# Patient Record
Sex: Female | Born: 1955 | Race: White | Hispanic: No | Smoking: Current every day smoker
Health system: Southern US, Community
[De-identification: ages and names within clinical notes are randomized; demographics above are authoritative.]

## PROBLEM LIST (undated history)

## (undated) DIAGNOSIS — L409 Psoriasis, unspecified: Secondary | ICD-10-CM

## (undated) DIAGNOSIS — E039 Hypothyroidism, unspecified: Secondary | ICD-10-CM

## (undated) DIAGNOSIS — F329 Major depressive disorder, single episode, unspecified: Secondary | ICD-10-CM

## (undated) DIAGNOSIS — F411 Generalized anxiety disorder: Secondary | ICD-10-CM

## (undated) DIAGNOSIS — R030 Elevated blood-pressure reading, without diagnosis of hypertension: Secondary | ICD-10-CM

## (undated) DIAGNOSIS — I10 Essential (primary) hypertension: Secondary | ICD-10-CM

## (undated) DIAGNOSIS — J209 Acute bronchitis, unspecified: Secondary | ICD-10-CM

## (undated) HISTORY — DX: Major depressive disorder, single episode, unspecified: F32.9

## (undated) HISTORY — DX: Elevated blood-pressure reading, without diagnosis of hypertension: R03.0

## (undated) HISTORY — DX: Generalized anxiety disorder: F41.1

## (undated) HISTORY — DX: Acute bronchitis, unspecified: J20.9

## (undated) HISTORY — DX: Essential (primary) hypertension: I10

## (undated) HISTORY — DX: Hypothyroidism, unspecified: E03.9

---

## 1998-07-17 ENCOUNTER — Other Ambulatory Visit: Admission: RE | Admit: 1998-07-17 | Discharge: 1998-07-17 | Payer: Self-pay | Admitting: Gynecology

## 1998-08-01 ENCOUNTER — Other Ambulatory Visit: Admission: RE | Admit: 1998-08-01 | Discharge: 1998-08-01 | Payer: Self-pay | Admitting: Gynecology

## 1999-02-18 ENCOUNTER — Other Ambulatory Visit: Admission: RE | Admit: 1999-02-18 | Discharge: 1999-02-18 | Payer: Self-pay | Admitting: Gynecology

## 1999-11-11 ENCOUNTER — Other Ambulatory Visit: Admission: RE | Admit: 1999-11-11 | Discharge: 1999-11-11 | Payer: Self-pay | Admitting: Gynecology

## 2001-05-13 ENCOUNTER — Other Ambulatory Visit: Admission: RE | Admit: 2001-05-13 | Discharge: 2001-05-13 | Payer: Self-pay | Admitting: Gynecology

## 2003-08-30 ENCOUNTER — Other Ambulatory Visit: Admission: RE | Admit: 2003-08-30 | Discharge: 2003-08-30 | Payer: Self-pay | Admitting: Obstetrics and Gynecology

## 2004-10-21 ENCOUNTER — Other Ambulatory Visit: Admission: RE | Admit: 2004-10-21 | Discharge: 2004-10-21 | Payer: Self-pay | Admitting: Obstetrics and Gynecology

## 2005-11-04 ENCOUNTER — Emergency Department (HOSPITAL_COMMUNITY): Admission: EM | Admit: 2005-11-04 | Discharge: 2005-11-04 | Payer: Self-pay | Admitting: Emergency Medicine

## 2006-02-13 ENCOUNTER — Ambulatory Visit (HOSPITAL_COMMUNITY): Admission: RE | Admit: 2006-02-13 | Discharge: 2006-02-13 | Payer: Self-pay | Admitting: Family Medicine

## 2006-03-16 ENCOUNTER — Emergency Department (HOSPITAL_COMMUNITY): Admission: EM | Admit: 2006-03-16 | Discharge: 2006-03-16 | Payer: Self-pay | Admitting: Emergency Medicine

## 2006-03-20 ENCOUNTER — Ambulatory Visit (HOSPITAL_COMMUNITY): Admission: RE | Admit: 2006-03-20 | Discharge: 2006-03-20 | Payer: Self-pay | Admitting: Urology

## 2006-04-02 ENCOUNTER — Ambulatory Visit (HOSPITAL_COMMUNITY): Admission: RE | Admit: 2006-04-02 | Discharge: 2006-04-02 | Payer: Self-pay | Admitting: Urology

## 2006-05-05 HISTORY — PX: URETHRA SURGERY: SHX824

## 2006-05-18 ENCOUNTER — Ambulatory Visit (HOSPITAL_COMMUNITY): Admission: RE | Admit: 2006-05-18 | Discharge: 2006-05-18 | Payer: Self-pay | Admitting: Urology

## 2006-05-27 ENCOUNTER — Inpatient Hospital Stay (HOSPITAL_COMMUNITY): Admission: RE | Admit: 2006-05-27 | Discharge: 2006-05-29 | Payer: Self-pay | Admitting: Urology

## 2006-05-27 ENCOUNTER — Encounter (INDEPENDENT_AMBULATORY_CARE_PROVIDER_SITE_OTHER): Payer: Self-pay | Admitting: Specialist

## 2006-08-25 ENCOUNTER — Ambulatory Visit (HOSPITAL_COMMUNITY): Admission: RE | Admit: 2006-08-25 | Discharge: 2006-08-25 | Payer: Self-pay | Admitting: Urology

## 2007-05-25 ENCOUNTER — Ambulatory Visit (HOSPITAL_COMMUNITY): Admission: RE | Admit: 2007-05-25 | Discharge: 2007-05-25 | Payer: Self-pay | Admitting: Urology

## 2007-08-12 LAB — HM MAMMOGRAPHY: HM Mammogram: NORMAL

## 2007-11-04 ENCOUNTER — Encounter: Payer: Self-pay | Admitting: Family Medicine

## 2008-08-15 ENCOUNTER — Encounter: Payer: Self-pay | Admitting: Family Medicine

## 2008-09-05 ENCOUNTER — Ambulatory Visit (HOSPITAL_COMMUNITY): Admission: RE | Admit: 2008-09-05 | Discharge: 2008-09-05 | Payer: Self-pay | Admitting: Urology

## 2008-09-08 ENCOUNTER — Ambulatory Visit: Payer: Self-pay | Admitting: Family Medicine

## 2008-09-08 DIAGNOSIS — E039 Hypothyroidism, unspecified: Secondary | ICD-10-CM

## 2008-09-08 DIAGNOSIS — F3289 Other specified depressive episodes: Secondary | ICD-10-CM

## 2008-09-08 DIAGNOSIS — F329 Major depressive disorder, single episode, unspecified: Secondary | ICD-10-CM

## 2008-09-08 DIAGNOSIS — I10 Essential (primary) hypertension: Secondary | ICD-10-CM

## 2008-09-08 DIAGNOSIS — F411 Generalized anxiety disorder: Secondary | ICD-10-CM

## 2008-09-08 HISTORY — DX: Essential (primary) hypertension: I10

## 2008-09-08 HISTORY — DX: Major depressive disorder, single episode, unspecified: F32.9

## 2008-09-08 HISTORY — DX: Generalized anxiety disorder: F41.1

## 2008-09-08 HISTORY — DX: Other specified depressive episodes: F32.89

## 2008-09-08 HISTORY — DX: Hypothyroidism, unspecified: E03.9

## 2008-09-20 ENCOUNTER — Ambulatory Visit: Payer: Self-pay | Admitting: Family Medicine

## 2008-09-21 ENCOUNTER — Telehealth: Payer: Self-pay | Admitting: Family Medicine

## 2008-10-16 ENCOUNTER — Telehealth (INDEPENDENT_AMBULATORY_CARE_PROVIDER_SITE_OTHER): Payer: Self-pay | Admitting: *Deleted

## 2009-02-20 ENCOUNTER — Ambulatory Visit: Payer: Self-pay | Admitting: Family Medicine

## 2009-02-20 DIAGNOSIS — J209 Acute bronchitis, unspecified: Secondary | ICD-10-CM

## 2009-02-20 HISTORY — DX: Acute bronchitis, unspecified: J20.9

## 2009-02-21 LAB — CONVERTED CEMR LAB: TSH: 1.59 microintl units/mL (ref 0.35–5.50)

## 2009-05-03 ENCOUNTER — Telehealth: Payer: Self-pay | Admitting: Family Medicine

## 2009-07-16 ENCOUNTER — Ambulatory Visit: Payer: Self-pay | Admitting: Family Medicine

## 2009-07-16 DIAGNOSIS — R030 Elevated blood-pressure reading, without diagnosis of hypertension: Secondary | ICD-10-CM | POA: Insufficient documentation

## 2009-07-16 HISTORY — DX: Elevated blood-pressure reading, without diagnosis of hypertension: R03.0

## 2009-10-17 ENCOUNTER — Ambulatory Visit: Payer: Self-pay | Admitting: Family Medicine

## 2009-10-17 DIAGNOSIS — L919 Hypertrophic disorder of the skin, unspecified: Secondary | ICD-10-CM

## 2009-10-17 DIAGNOSIS — R5381 Other malaise: Secondary | ICD-10-CM | POA: Insufficient documentation

## 2009-10-17 DIAGNOSIS — L409 Psoriasis, unspecified: Secondary | ICD-10-CM | POA: Insufficient documentation

## 2009-10-17 DIAGNOSIS — R5383 Other fatigue: Secondary | ICD-10-CM

## 2009-10-17 DIAGNOSIS — L909 Atrophic disorder of skin, unspecified: Secondary | ICD-10-CM | POA: Insufficient documentation

## 2009-10-18 ENCOUNTER — Encounter: Payer: Self-pay | Admitting: Family Medicine

## 2009-10-31 ENCOUNTER — Telehealth (INDEPENDENT_AMBULATORY_CARE_PROVIDER_SITE_OTHER): Payer: Self-pay | Admitting: *Deleted

## 2009-11-01 ENCOUNTER — Telehealth: Payer: Self-pay | Admitting: Family Medicine

## 2009-12-24 ENCOUNTER — Telehealth: Payer: Self-pay | Admitting: Family Medicine

## 2010-04-10 ENCOUNTER — Ambulatory Visit: Payer: Self-pay | Admitting: Family Medicine

## 2010-04-10 DIAGNOSIS — R05 Cough: Secondary | ICD-10-CM

## 2010-04-10 DIAGNOSIS — R059 Cough, unspecified: Secondary | ICD-10-CM | POA: Insufficient documentation

## 2010-06-04 NOTE — Assessment & Plan Note (Signed)
Summary: back pain/cjr   Vital Signs:  Patient profile:   55 year old female Weight:      168 pounds Temp:     98.2 degrees F oral BP sitting:   138 / 92  (left arm) Cuff size:   regular  Vitals Entered By: Sid Falcon LPN (April 10, 2010 3:38 PM)  History of Present Illness: Patient seen with back pain left periscapular region radiating toward the midline. Present for approximately 2 weeks in conjunction with cough. Pain is mild to moderate severity. She has sharp quality relieved with ibuprofen. Increased with movement. No pleuritic pain. Cough is productive of clear mucus. No hemoptysis. No fevers or chills. Mucinex DM with mild relief. Patient has a long history of smoking. No appetite or weight changes. No abdominal pain.  Allergies: 1)  ! Codeine Sulfate (Codeine Sulfate) 2)  ! Claritin-D 24 Hour (Loratadine-Pseudoephedrine)  Past History:  Past Medical History: Last updated: 07/16/2009 Depression Arthritis Emphysema/Chrinic bronchitis Hypothyroid Crohn's diease  Review of Systems  The patient denies anorexia, fever, weight loss, prolonged cough, and hemoptysis.    Physical Exam  General:  Well-developed,well-nourished,in no acute distress; alert,appropriate and cooperative throughout examination Mouth:  Oral mucosa and oropharynx without lesions or exudates.  Teeth in good repair. Neck:  No deformities, masses, or tenderness noted. Lungs:  Normal respiratory effort, chest expands symmetrically. Lungs are clear to auscultation, no crackles or wheezes. Heart:  normal rate, regular rhythm, and no gallop.   Msk:  patient has some soft tissue tenderness left parathoracic region just below the scapula. No rashes. Chest is clear to auscultation this region. No spinal tenderness Extremities:  No clubbing, cyanosis, edema, or deformity noted with normal full range of motion of all joints.     Impression & Recommendations:  Problem # 1:  BACK PAIN, THORACIC REGION  (ICD-724.1) suspect muscular.  ?related to recent cough.  Trial Celebrex 200mg  daily with samples given.  Problem # 2:  COUGH (ICD-786.2) probably viral. consider CXR if no better in 2 weeks.  Complete Medication List: 1)  Alprazolam 0.5 Mg Tabs (Alprazolam) .... 1/2 tab daily 2)  Amitriptyline Hcl 50 Mg Tabs (Amitriptyline hcl) .... One tab daily 3)  Levoxyl 125 Mcg Tabs (Levothyroxine sodium) .... Once daily 4)  Fish Oil Double Strength 1200 Mg Caps (Omega-3 fatty acids) .... Once daily 5)  Calcium 600/vitamin D 600-400 Mg-unit Tabs (Calcium carbonate-vitamin d) .... Once daily 6)  Multi For Her 50+ Tabs (Multiple vitamins-minerals) .... Once daily 7)  Prozac 20 Mg Caps (Fluoxetine hcl) .... Once daily  Patient Instructions: 1)  Celebrex 200 mg one tablet daily 2)  Touch base if cough and back pain or not improving over the next one to 2 weeks   Orders Added: 1)  Est. Patient Level III [60454]

## 2010-06-04 NOTE — Progress Notes (Signed)
Summary: LMTCB  Phone Note Call from Patient   Caller: Patient Call For: Evelena Peat MD Summary of Call: Pt calls asking to talk to a nurse. Called back and left message to call back. Tried again & still reached vm.  Rudy Jew, RN  October 31, 2009 3:34 PM  Initial call taken by: Lynann Beaver CMA,  October 31, 2009 11:41 AM  Follow-up for Phone Call        noted .  Try again by tomorrow and if no response by then wait to see if pt calls back. Follow-up by: Evelena Peat MD,  October 31, 2009 4:13 PM  Additional Follow-up for Phone Call Additional follow up Details #1::        LMTCB again. Additional Follow-up by: Lynann Beaver CMA,  November 01, 2009 8:59 AM

## 2010-06-04 NOTE — Assessment & Plan Note (Signed)
Summary: flu like symptoms/cjr   Vital Signs:  Patient profile:   55 year old female Weight:      168 pounds Temp:     98.2 degrees F oral BP sitting:   140 / 90  (left arm) Cuff size:   regular  Vitals Entered By: Kathrynn Speed CMA (October 17, 2009 3:56 PM)  History of Present Illness: Patient seen for the following items.  Two day history of flulike symptoms. She has bodyaches, sore throat and mild headache. Does have some nondescript bilateral back pain around her kidneys. Mild frequency but no burning with urination. Concerned about possibility of UTI.  prior history of ureteral surgery 2 years ago.  Patient needs refills of thyroid medication and Prozac which has taken for some depression issues. Depression is stable.   Compliant with meds.  Patient has asymptomatic skin lesion left cheek twitching like to have treated. This has grown over the past couple of months. No personal history of skin cancer.  Current Medications (verified): 1)  Alprazolam 0.5 Mg Tabs (Alprazolam) .... 1/2 Tab Daily 2)  Amitriptyline Hcl 50 Mg Tabs (Amitriptyline Hcl) .... One Tab Daily 3)  Levoxyl 125 Mcg Tabs (Levothyroxine Sodium) .... Once Daily 4)  Fish Oil Double Strength 1200 Mg Caps (Omega-3 Fatty Acids) .... Once Daily 5)  Calcium 600/vitamin D 600-400 Mg-Unit Tabs (Calcium Carbonate-Vitamin D) .... Once Daily 6)  Multi For Her 50+  Tabs (Multiple Vitamins-Minerals) .... Once Daily 7)  Prozac 20 Mg Caps (Fluoxetine Hcl) .... Once Daily  Allergies (verified): 1)  ! Codeine Sulfate (Codeine Sulfate) 2)  ! 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    Physical Exam  General:  Well-developed,well-nourished,in no acute distress; alert,appropriate and cooperative throughout examination Head:  Normocephalic and atraumatic without obvious abnormalities. No apparent alopecia or balding. Ears:  External ear exam shows no significant lesions or deformities.  Otoscopic examination reveals clear canals, tympanic membranes are intact bilaterally without bulging, retraction, inflammation or discharge. Hearing is grossly normal bilaterally. Mouth:  Oral mucosa and oropharynx without lesions or exudates.  Teeth in good repair. Neck:  No deformities, masses, or tenderness noted. Lungs:  Normal respiratory effort, chest expands symmetrically. Lungs are clear to auscultation, no crackles or wheezes. Heart:  Normal rate and regular rhythm. S1 and S2 normal without gallop, murmur, click, rub or other extra sounds. Skin:  left cheek region reveals small skin tag. This has narrow base and no significant pigmentation change   Impression & Recommendations:  Problem # 1:  MALAISE (ICD-780.79) Assessment New pyuria, rule out UTI.   Urine cx done. Orders: UA Dipstick w/o Micro (manual) (53664) T-Culture, Urine ([id number])  Problem # 2:  SKIN TAG (ICD-701.9)  discussed risk and benefits of treatment liquid nitrogen and patient consented. Left cheek lesion was treated without difficulty and patient tolerated well  Orders: Cryotherapy/Destruction benign or premalignant lesion (1st lesion)  (17000)  Problem # 3:  HYPOTHYROIDISM (ICD-244.9)  Her updated medication list for this problem includes:    Levoxyl 125 Mcg Tabs (Levothyroxine sodium) .Marland KitchenMarland KitchenMarland KitchenMarland Kitchen  Once  daily  Problem # 4:  DEPRESSION (ICD-311)  Her updated medication list for this problem includes:    Alprazolam 0.5 Mg Tabs (Alprazolam) .Marland Kitchen... 1/2 tab daily    Amitriptyline Hcl 50 Mg Tabs (Amitriptyline hcl) ..... One tab daily    Prozac 20 Mg Caps (Fluoxetine hcl) ..... Once daily  Problem # 5:  ANXIETY (ICD-300.00) refilled alprazolam which she uses sparingly Her updated medication list for this problem includes:    Alprazolam 0.5 Mg Tabs (Alprazolam) .Marland Kitchen... 1/2 tab daily    Amitriptyline Hcl 50 Mg Tabs (Amitriptyline hcl) ..... One tab daily    Prozac 20 Mg Caps (Fluoxetine hcl) ..... Once daily  Complete Medication List: 1)  Alprazolam 0.5 Mg Tabs (Alprazolam) .... 1/2 tab daily 2)  Amitriptyline Hcl 50 Mg Tabs (Amitriptyline hcl) .... One tab daily 3)  Levoxyl 125 Mcg Tabs (Levothyroxine sodium) .... Once daily 4)  Fish Oil Double Strength 1200 Mg Caps (Omega-3 fatty acids) .... Once daily 5)  Calcium 600/vitamin D 600-400 Mg-unit Tabs (Calcium carbonate-vitamin d) .... Once daily 6)  Multi For Her 50+ Tabs (Multiple vitamins-minerals) .... Once daily 7)  Prozac 20 Mg Caps (Fluoxetine hcl) .... Once daily  Patient Instructions: 1)  Drink plenty of fluids up to 3-4 quarts a day. Cranberry juice is especially recommended in addition to large amounts of water. Avoid caffeine & carbonated drinks, they tend to irritate the bladder, Return in 3-5 days if you're not better: sooner if you're feeling worse.  2)  Please schedule a follow-up appointment in 6 months .  3)  TSH prior to visit ICD-9 : 244.9 Prescriptions: ALPRAZOLAM 0.5 MG TABS (ALPRAZOLAM) 1/2 tab daily  #30 x 3   Entered and Authorized by:   Evelena Peat MD   Signed by:   Evelena Peat MD on 10/17/2009   Method used:   Print then Give to Patient   RxID:   [id number] PROZAC 20 MG CAPS (FLUOXETINE HCL) once daily  #90 x 3   Entered and Authorized by:   Evelena Peat MD   Signed by:   Evelena Peat MD on  10/17/2009   Method used:   Electronically to        [facility]* (retail)       [address] [place], [place]  [address]       Ph: [phone] or [phone]       Fax: [phone]   RxID:   [id number] LEVOXYL 125 MCG TABS (LEVOTHYROXINE SODIUM) once daily  #90 x 3   Entered and Authorized by:   Evelena Peat MD   Signed by:   Evelena Peat MD on 10/17/2009   Method used:   Electronically to        [facility]* (retail)       [address] [place], [place]  [address]       Ph: [phone] or [phone]       Fax: [phone]   RxID:   [id number]

## 2010-06-04 NOTE — Progress Notes (Signed)
Summary: asking for recommendation  Phone Note Call from Patient   Caller: Patient Call For: Evelena Peat MD Summary of Call: Father has dementia and DM.......is asking if Dr. Caryl Never would give a recommendation of a good MD for him......? neurology or geriatrics??? [phone] Initial call taken by: Lynann Beaver CMA,  November 01, 2009 9:15 AM  Follow-up for Phone Call        He really needs a good primary care physician.  One issue is finding someone that takes Medicare.  Primary care doctor can address both issues. Follow-up by: Evelena Peat MD,  November 01, 2009 9:59 AM  Additional Follow-up for Phone Call Additional follow up Details #1::        Notified Raul...Marland KitchenMarland KitchenMarland Kitchen Additional Follow-up by: Lynann Beaver CMA,  November 01, 2009 10:49 AM

## 2010-06-04 NOTE — Assessment & Plan Note (Signed)
Summary: bp check//ccm   Vital Signs:  Patient profile:   55 year old female Weight:      163 pounds BMI:     28.30 Temp:     98.7 degrees F oral BP sitting:   124 / 80  (left arm) Cuff size:   regular  Vitals Entered By: Sid Falcon LPN (July 16, 2009 1:03 PM)  Nutrition Counseling: Patient's BMI is greater than 25 and therefore counseled on weight management options.  Serial Vital Signs/Assessments:  Time      Position  BP       Pulse  Resp  Temp     By                     122/80                         Evelena Peat MD  CC: BP check   History of Present Illness: Patient here to evaluate elevated blood pressure. Never treated for hypertension. Recent elevation of 140/100 at cardiologist's office. Denies any headaches, dizziness, or chest pains. No regular exercise. Drinks about 12 ounces of alcohol per day. No nonsteroidal use.  Allergies: 1)  ! Codeine Sulfate (Codeine Sulfate) 2)  ! Claritin-D 24 Hour (Loratadine-Pseudoephedrine)  Past History:  Past Medical History: Depression Arthritis Emphysema/Chrinic bronchitis Hypothyroid Crohn's diease  Review of Systems  The patient denies anorexia, fever, weight loss, weight gain, chest pain, syncope, dyspnea on exertion, peripheral edema, and headaches.    Physical Exam  General:  Well-developed,well-nourished,in no acute distress; alert,appropriate and cooperative throughout examination Mouth:  Oral mucosa and oropharynx without lesions or exudates.  Teeth in good repair. Neck:  No deformities, masses, or tenderness noted. Lungs:  Normal respiratory effort, chest expands symmetrically. Lungs are clear to auscultation, no crackles or wheezes. Heart:  Normal rate and regular rhythm. S1 and S2 normal without gallop, murmur, click, rub or other extra sounds. Extremities:  No clubbing, cyanosis, edema, or deformity noted with normal full range of motion of all joints.     Impression & Recommendations:  Problem #  1:  ELEVATED BLOOD PRESSURE (ICD-796.2) normal here today.  Reduce wine consumption to no more than 10 oz/day.  Monitor closely at home.  exercise and reduce Na intake.  Complete Medication List: 1)  Alprazolam 0.5 Mg Tabs (Alprazolam) .... 1/2 tab daily 2)  Amitriptyline Hcl 50 Mg Tabs (Amitriptyline hcl) .... One tab daily 3)  Levoxyl 125 Mcg Tabs (Levothyroxine sodium) .... Once daily 4)  Fish Oil Double Strength 1200 Mg Caps (Omega-3 fatty acids) .... Once daily 5)  Calcium 600/vitamin D 600-400 Mg-unit Tabs (Calcium carbonate-vitamin d) .... Once daily 6)  Multi For Her 50+ Tabs (Multiple vitamins-minerals) .... Once daily 7)  Prozac 20 Mg Caps (Fluoxetine hcl) .... Once daily 8)  Lomotil 2.5-0.025 Mg Tabs (Diphenoxylate-atropine) .... One to two tablets qid as needed for diarrhea. 9)  Azithromycin 250 Mg Tabs (Azithromycin) .... Two by mouth today then one by mouth once daily for 4 days  Patient Instructions: 1)  Consume no more than 12 ounces wine/day. 2)  Limit your Sodium(salt) .  3)  It is important that you exercise reguarly at least 20 minutes 5 times a week. If you develop chest pain, have severe difficulty breathing, or feel very tired, stop exercising immediately and seek medical attention.  4)  Check your  Blood Pressure regularly . If it is above: 140/90  you should make an appointment.

## 2010-06-04 NOTE — Progress Notes (Signed)
Summary: Alprazolam refill  Phone Note Call from Patient Call back at Home Phone [phone]   Caller: Patient Call For: Colleen Peat MD Reason for Call: Lab or Test Results Summary of Call: We received several requests from [facility] in [place] for Alprazolam.  Pt was given written Rx on 10/17/09 at [facility].  Pt informed.  She states she gave it to pharmacist at the [facility] in [place].  Pt has checked with the pharmacy, they have no record.  She cannot find it.  Requesting refills. Per Nedra Hai at [facility], last filled 6/20 from Rx written on 05-03-2009  Initial call taken by: Sid Falcon LPN,  December 24, 2009 2:32 PM  Follow-up for Phone Call        She has never misused.  May refill #30 with 3 refills. Follow-up by: Colleen Peat MD,  December 24, 2009 5:51 PM  Additional Follow-up for Phone Call Additional follow up Details #1::        Pt informed on personally identified VM Rx called into [facility] Additional Follow-up by: Sid Falcon LPN,  December 25, 2009 8:33 AM    Prescriptions: ALPRAZOLAM 0.5 MG TABS (ALPRAZOLAM) 1/2 tab daily  #30 x 3   Entered by:   Sid Falcon LPN   Authorized by:   Colleen Peat MD   Signed by:   Sid Falcon LPN on 09/81/1914   Method used:   Telephoned to ...       [facility] [address]* (retail)       [address] [place], [place]  [address]       Ph: [phone]       Fax: [phone]   RxID:   [id number]

## 2010-07-02 ENCOUNTER — Other Ambulatory Visit: Payer: Self-pay | Admitting: Family Medicine

## 2010-07-04 NOTE — Telephone Encounter (Signed)
Last filled 12/25/09, #30 with 3 refills

## 2010-07-05 NOTE — Telephone Encounter (Signed)
Ok to refill for 3 months.  

## 2010-07-05 NOTE — Telephone Encounter (Signed)
Last OV 04/10/10, last refill 12/25/09 (back pain) 1/2 tab daily, #30 , RF 3

## 2010-07-08 ENCOUNTER — Other Ambulatory Visit: Payer: Self-pay | Admitting: *Deleted

## 2010-07-08 DIAGNOSIS — F411 Generalized anxiety disorder: Secondary | ICD-10-CM

## 2010-07-08 MED ORDER — ALPRAZOLAM 0.5 MG PO TABS: ORAL_TABLET | ORAL | Status: DC

## 2010-07-08 NOTE — Telephone Encounter (Signed)
Rx called in 

## 2010-07-08 NOTE — Telephone Encounter (Signed)
Pt requesting refill Alprazolam 0.5 mg, pt may take 1/2 tab daily prn.  Last filled 12/24/09  Pt was seen in Dec, 2011 for back pain [employer]

## 2010-07-08 NOTE — Telephone Encounter (Signed)
May refill #30 with 3 refills

## 2010-07-08 NOTE — Telephone Encounter (Signed)
Last filled 12/24/09 #30 with 3 refills.  Pt takes 1/2 tab daily

## 2010-07-08 NOTE — Telephone Encounter (Signed)
OK to refill for 6 months 

## 2010-07-09 NOTE — Telephone Encounter (Signed)
Duplicate request

## 2010-08-12 ENCOUNTER — Encounter: Payer: Self-pay | Admitting: Family Medicine

## 2010-08-12 ENCOUNTER — Ambulatory Visit (INDEPENDENT_AMBULATORY_CARE_PROVIDER_SITE_OTHER): Payer: BLUE CROSS/BLUE SHIELD | Admitting: Family Medicine

## 2010-08-12 VITALS — BP 140/100 | Temp 98.3°F | Ht 64.0 in | Wt 154.0 lb

## 2010-08-12 DIAGNOSIS — R062 Wheezing: Secondary | ICD-10-CM

## 2010-08-12 DIAGNOSIS — R05 Cough: Secondary | ICD-10-CM

## 2010-08-12 MED ORDER — METHYLPREDNISOLONE ACETATE 80 MG/ML IJ SUSP
80.0000 mg | Freq: Once | INTRAMUSCULAR | Status: AC
  Administered 2010-08-12: 80 mg via INTRAMUSCULAR

## 2010-08-12 NOTE — Patient Instructions (Signed)
Colleen Owen out a full 7 day course of current antibiotic. Follow up promptly for any fever over 101 or worsening symptoms

## 2010-08-12 NOTE — Progress Notes (Signed)
  Subjective:    Patient ID: Colleen Owen, female    DOB: December 15, 1955, 55 y.o.   MRN: [id number]  HPI Patient here with illness which started last week about 4-5 days ago. Started with some diffuse headaches, mostly nonproductive cough, malaise, nasal congestion, and body aches. Over the weekend probable low grade fever. Started herself on amoxicillin 900 mg twice a day. 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 Granddaughter has similar symptoms.  She does still smoke. Some wheezing. Some dyspnea with exertion   Review of Systems  Constitutional: Positive for fever, chills and fatigue. Negative for appetite change and unexpected weight change.  HENT: Positive for congestion and postnasal drip. Negative for sore throat, trouble swallowing and voice change.   Respiratory: Positive for cough and wheezing. Negative for shortness of breath.   Cardiovascular: Negative for chest pain and leg swelling.       Objective:   Physical Exam  Constitutional: She appears well-developed and well-nourished.  HENT:  Head: Normocephalic and atraumatic.  Right Ear: External ear normal.  Left Ear: External ear normal.  Mouth/Throat: No oropharyngeal exudate.  Neck: Neck supple. No thyromegaly present.  Cardiovascular: Normal rate and regular rhythm.   Pulmonary/Chest:       Patient has some diffuse wheezes but no retractions. No rales. Symmetric breath sounds.  Musculoskeletal: She exhibits no edema.  Lymphadenopathy:    She has no cervical adenopathy.          Assessment & Plan:  Probable viral syndrome in a patient with likely COPD. She has reactive airway component. She's been intolerant to oral prednisone previously with insomnia. Depo-Medrol 80 mg IM and finish out antibiotic which she started already. Followup probably for fever or worsening symptoms

## 2010-09-02 ENCOUNTER — Other Ambulatory Visit: Payer: Self-pay | Admitting: Family Medicine

## 2010-09-20 NOTE — Op Note (Signed)
NAMEKIRSTI, Colleen Owen                 ACCOUNT NO.:  [id number]   MEDICAL RECORD NO.:  [id number]          PATIENT TYPE:  INP   LOCATION:  1431                         FACILITY:  [facility]   PHYSICIAN:  Heloise Purpura, MD      DATE OF BIRTH:  Sep 27, 1955   DATE OF PROCEDURE:  05/27/2006  DATE OF DISCHARGE:                               OPERATIVE REPORT   PREOPERATIVE DIAGNOSIS:  Left ureteropelvic junction obstruction.   POSTOPERATIVE DIAGNOSIS:  Left ureteropelvic junction obstruction.   PROCEDURES.:  1. Cystoscopy.  2. Left retrograde pyelography.  3. Left ureteral stent placement (6 x 28).  4. Left robotic assisted laparoscopic dismembered pyeloplasty.   SURGEON:  Dr. Heloise Purpura.   ASSISTANT:  Cornelious Bryant, MD.   ANESTHESIA:  General.   COMPLICATIONS:  None.   ESTIMATED BLOOD LOSS:  Minimal.   SPECIMEN:  Left ureteropelvic junction.   DRAINS:  1. 16-French Foley catheter.  2. #15 Blake perinephric drain.   RADIOLOGIC FINDINGS:  Retrograde pyelography of the left collecting  system demonstrated a dilated renal pelvis down to the level of the UPJ  without filling defects or other abnormalities.   INDICATION:  Colleen Owen a 55 year old female who initially presented with  severe left-sided flank pain.  She underwent a CT scan which  demonstrated left hydronephrosis down to the level of the ureteropelvic  junction.  She subsequently underwent further evaluation including  retrograde pyelography and a nuclear medicine renal scan with findings  consistent with a ureteropelvic junction obstruction.  After discussing  management options, she elected to proceed with the above procedures.  The potential risks and benefits were discussed with the patient and she  consented.   DESCRIPTION OF PROCEDURE:  The patient was taken to the operating room  and a general anesthetic was administered.  She was given preoperative  antibiotics, placed in the dorsal lithotomy position, prepped  and draped  in the usual sterile fashion.  In addition, the patient was on chronic  steroid therapy and was administered 100 mg of intravenous  hydrocortisone for a stress dose.  A preoperative time-out was  performed.  Cystourethroscopy was then performed and the left ureteral  orifice was identified.  A 6-French open-ended ureteral catheter was  then used to perform retrograde pyelography.  This demonstrated the  patient's indwelling nephrostomy tube to be in place.  The left renal  pelvis was dilated without evidence of filling defects.  There was noted  to be a normal caliber ureter without filling defects.  These findings  were consistent with her known ureteropelvic junction obstruction.  A  0.038 sensor guidewire was then inserted through the ureteral catheter  and up into the renal pelvis under fluoroscopic guidance.  An 8 x 28  double-J ureteral stent was then advanced over the wire and  appropriately positioned under fluoroscopic and cystoscopic guidance.  The wire was then removed with a good curl noted in the renal pelvis as  well as in the bladder.  The cystoscope was then removed and a 16-French  Foley catheter was placed.  The patient was then  repositioned in the  left modified flank position with care to pad all potential pressure  points. She was then reprepped and draped in the usual sterile fashion.  Another preoperative time-out was performed at this point.  A site was  then selected just superior to the umbilicus for placement of the camera  port.  This was placed using a standard open Hassan technique.  This  allowed entry into the peritoneal cavity under direct vision.  A 12 mm  port was then placed and a pneumoperitoneum was established.  The 30  degree lens was then inserted through the port and used to inspect the  abdomen.  There were noted to be no intra-abdominal adhesions or other  abnormalities and no intra-abdominal injuries.  Attention then turned to   placement of the remaining ports.  An 8 mm robotic port was placed  approximately midway between the umbilicus and xiphoid process just to  the left of midline.  An additional 8 mm robotic port was placed more  inferior and laterally in the left lower quadrant.  A 12 mm port was  then placed in the lower midline for laparoscopic assistance.  All ports  were placed under direct vision and without difficulty.  The surgical  cart was then docked.  With the aid of the cautery scissors, the white  line of Toldt was incised along the length of the descending colon  allowing the colon to be mobilized medially in the space between the  anterior layer of Gerota's fascia and the colonic mesentery to be  developed.  This helped to expose the retroperitoneum.  The ureter was  then identified and was able to be lifted anteriorly off the psoas  muscle.  There was noted to be a structure just lateral to the ureter  that appeared to be peristalsing.  This was examined and followed up  superiorly and eventually found simply to be a prominent gonadal vein.  The ureter was followed superiorly until the renal pelvis was  identified.  A small stenotic segment at the ureteropelvic junction was  identified and appeared to represent the etiology of the patient's  ureteropelvic junction obstruction.  At this point, the round tip  scissors were used to excise the stenotic segment of the UPJ.  The renal  pelvis was then spatulated medially and the ureter was spatulated  laterally. 4-0 Vicryl interrupted sutures were then placed at the  lateral and medial apices and used to reapproximate the ureter and renal  pelvis at these locations.  4-0 Vicryl running sutures were then used to  close the posterior and anterior aspect of the anastomosis.  Prior to  final closure of the anterior anastomosis, the ureteral stent was placed  back and positioned within the renal pelvis.  A #15 Blake drain was then brought through the  most lateral and inferior robotic port and  appropriately positioned near the anastomosis.  The colon was then  placed back up over the kidney.  The mesentery was noted to be intact  with no mesenteric windows.  The attention then turned to closure.  The  patient's drain was secured to the skin with a nylon suture.  The 12 mm  port sites were then closed with interrupted #0 Vicryl sutures which  were placed with the aid of the suture passer device.  All remaining  ports were removed under direct vision and hemostasis was ensured.  All  port sites were then injected with 0.25% Marcaine and reapproximated at  the skin level with staples.  Sterile dressings were applied.  The  patient appeared to tolerate the procedure well and without  complications.  She was able to be extubated and transferred to the  recovery unit in satisfactory condition.           ______________________________  Heloise Purpura, MD  Electronically Signed     LB/MEDQ  D:  05/27/2006  T:  05/27/2006  Job:  [id number]

## 2010-09-20 NOTE — Discharge Summary (Signed)
Colleen, Owen                 ACCOUNT NO.:  [id number]   MEDICAL RECORD NO.:  [id number]          PATIENT TYPE:  INP   LOCATION:  1431                         FACILITY:  [facility]   PHYSICIAN:  Heloise Purpura, MD      DATE OF BIRTH:  Jan 06, 1956   DATE OF ADMISSION:  05/27/2006  DATE OF DISCHARGE:  05/29/2006                               DISCHARGE SUMMARY   ADMISSION DIAGNOSIS:  Left ureteropelvic junction obstruction.   POSTOPERATIVE DIAGNOSIS:  Left ureteropelvic junction obstruction.   PROCEDURE:  Robotic-assisted left laparoscopic left dismembered  pyeloplasty.   HISTORY AND PHYSICAL:  For full details, please see admission history  physical.  Briefly Colleen Owen is a 55 year old female who began having severe  left-sided flank pain.  She underwent further evaluation and was found  to have findings consistent with a left UPJ obstruction.  She was found  to have preserved renal function on room nuclear medicine renography.  After discussing management options, she elected to proceed with a  dismembered pyeloplasty.  After discussing different surgical  approaches, she wished to proceed with a minimally invasive robotic-  assisted laparoscopic approach.   HOSPITAL COURSE:  The patient was taken to the operating room on May 27, 2006 and underwent the above procedure.  She tolerated this well and  without complications.  Postoperatively, she was able to be transferred  to a regular hospital room following recovery from anesthesia.  On  postoperative day #1, she was able to begin a clear liquid diet, and her  diet was gradually advance throughout the day.  She was able to begin  ambulating, which she did without difficulty, and was able to be  maintained on oral pain medication.  She was administered stress dose  steroids around the time of her procedure which were then gradually  weaned down.  She eventually was placed back on her oral prednisone, and  by postoperative day #2 was  tolerating a regular diet, ambulating  without difficulty, and had otherwise met all discharge criteria.  In  addition, her Foley catheter had been removed on postoperative day #1,  and she maintain minimal output from her perinephric drain.  This was  checked for a creatinine value, and it was found to be consistent with  serum, indicating no evidence of a urine leak.  Her drain was therefore  removed prior to discharge.   DISPOSITION:  Home.   DISCHARGE MEDICATIONS:  Jacynda has been instructed to resume her regular  medications, excepting any aspirin, nonsteroidal anti-inflammatory  drugs, or herbal supplements.  She was told to resume her prednisone as  before surgery on a 5 mg every other day basis, with plans to contact  Dr. Randa Evens next week for further instruction.  I also gave her a  prescription to take Vicodin as needed for pain, and told her to use  Colace as a stool softener.   DISCHARGE INSTRUCTIONS:  Shaylee was instructed to refrain from any heavy  lifting or strenuous activity.  She was instructed on signs and symptoms  of wound infection, and told to call should  she have any problems.   FOLLOW UP:  Prisha will follow up in approximately 1-2 weeks for removal  of her staples.           ______________________________  Heloise Purpura, MD  Electronically Signed     LB/MEDQ  D:  05/29/2006  T:  05/29/2006  Job:  [id number]   cc:   Fayrene Fearing L. Malon Kindle., M.D.  Fax: [phone]

## 2010-09-20 NOTE — H&P (Signed)
NAMEINEZ, STANTZ                 ACCOUNT NO.:  [id number]   MEDICAL RECORD NO.:  [id number]          PATIENT TYPE:  INP   LOCATION:  Y782                         FACILITY:  [facility]   PHYSICIAN:  Heloise Purpura, MD      DATE OF BIRTH:  Feb 05, 1956   DATE OF ADMISSION:  05/27/2006  DATE OF DISCHARGE:                              HISTORY & PHYSICAL   CHIEF COMPLAINT:  Left ureteropelvic junction obstruction.   HISTORY:  Ms. Treichler is a 55 year old female who initially presented with  severe left-sided flank pain.  She was subsequently found to have left-  sided hydronephrosis.  She underwent further evaluation including  retrograde pyelography and a Lasix renogram which demonstrated findings  consistent with the ureteropelvic junction obstruction.  Initially,  ureteral stent was placed.  This was subsequently removed, and a  percutaneous nephrostomy tube was placed to allow ureteral inflammation  to subside.  She presents today for definitive correction of her UPJ  obstruction.  After having discussed options previously, the patient has  elected to proceed with a robotic-assisted laparoscopic dismembered  pyeloplasty.   PAST MEDICAL HISTORY:  1. Hypothyroidism.  2. Ulcerative colitis.  3. History of shingles.   PAST SURGICAL HISTORY:  Bunionectomy.   MEDICATIONS:  1. Levoxyl.  2. Asacol.  3. Prednisone 5 mg p.o. every other day.  4. Amitriptyline 25 mg daily.   ALLERGIES:  INTOLERANCE TO CODEINE WITH VOMITING.  NO TRUE DRUG  ALLERGIES.   SOCIAL HISTORY:  The patient does smoke 2 packs cigarettes a day which  she has done for 35 years.  She drinks 1 to 2 glasses of alcohol per  day.   FAMILY HISTORY:  The patient's mother had breast cancer and  hypertension.  Her grandfather had kidney failure.   REVIEW OF SYSTEMS:  Negative except for abdominal pain and diarrhea.   PHYSICAL EXAMINATION:  CONSTITUTIONAL:  Alert and oriented in no acute  distress.  CARDIOVASCULAR:  Regular  rate and rhythm without obvious murmurs.  LUNGS:  Clear bilaterally.  ABDOMEN:  Soft, nontender, nondistended.  BACK:  The patient has an indwelling left nephrostomy tube.  EXTREMITIES:  No edema.   IMPRESSION:  Left ureteropelvic junction obstruction.   PLAN:  The patient will undergo cystoscopy with retrograde pyelography  and ureteral stent placement.  She will then undergo a robotic-assisted  laparoscopic dismembered pyeloplasty.  Risks and benefits have been  discussed with the patient.           ______________________________  Heloise Purpura, MD  Electronically Signed     LB/MEDQ  D:  05/27/2006  T:  05/27/2006  Job:  [id number]

## 2010-09-20 NOTE — Op Note (Signed)
Colleen Owen, Colleen Owen                 ACCOUNT NO.:  [id number]   MEDICAL RECORD NO.:  [id number]          PATIENT TYPE:  AMB   LOCATION:  DAY                          FACILITY:  [facility]   PHYSICIAN:  Heloise Purpura, MD      DATE OF BIRTH:  [birth year]   DATE OF PROCEDURE:  03/20/2006  DATE OF DISCHARGE:                                 OPERATIVE REPORT   PREOPERATIVE DIAGNOSES:  1. Left hydronephrosis.  2. Left flank pain.   POSTOPERATIVE DIAGNOSES:  1. Left hydronephrosis.  2. Left flank pain.   PROCEDURES:  1. Cystoscopy.  2. Left retrograde pyelography.  3. Left ureteral stent placement (6 x 24).   SURGEON:  Crecencio Mc, M.D.   ANESTHESIA:  General.   COMPLICATIONS:  None.   INDICATIONS:  Colleen Owen is a 55 year old female who has had multiple recent  episodes of severe left-sided flank pain.  She was felt to possibly have a  kidney stone and underwent a CT scan, which demonstrated left-sided  hydronephrosis with a normal caliber ureter.  No calculi was identified.  She was recently evaluated by Dr. Annabell Howells, and after discussion, the patient  elected to proceed with cystoscopy and retrograde pyelography for diagnostic  purposes -- as well as to undergo ureteral stent placement to relieve her  discomfort.  Potential risks and benefits were discussed with the patient  and she consented.   DESCRIPTION OF PROCEDURE:  The patient was taken to the operating room and a  general anesthetic was administered.  She was given preoperative  antibiotics, placed in the dorsal lithotomy position, and prepped and draped  in the usual sterile fashion.  Next, a preoperative time-out was performed.  Cystourethroscopy was performed.  This demonstrated a normal appearing  bladder and the ureteral orifices in the normal anatomic position.  No  evidence of any bladder stones, tumors, or other mucosal pathology was  identified.  Attention was then turned to the left ureteral orifice.  It was  cannulated  with a 6-French open-ended ureteral catheter and contrast was  injected.  This demonstrated a normal caliber ureter without filling defects  or other abnormalities.  There was noted to be a narrowed ureteropelvic  junction, with a significantly dilated left renal pelvis.  No obvious  filling defects were identified.  A 0.038 sensor guidewire was then advanced  up into the renal pelvis under fluoroscopic guidance.  The ureteral catheter  was removed and a 6 x 24 double-J ureteral stent was advanced over the wire  and appropriately  positioned under fluoroscopic and cystoscopic guidance.  The wire was  removed, with a good curl noted in the renal pelvis as well as in the  bladder.  The patient's bladder was emptied.  She tolerated the procedure  well without complications.  She was able to be awakened and transferred to  the recovery unit in satisfactory condition.           ______________________________  Heloise Purpura, MD  Electronically Signed     LB/MEDQ  D:  03/20/2006  T:  03/21/2006  Job:  [id number]

## 2010-09-24 ENCOUNTER — Encounter: Payer: Self-pay | Admitting: Family Medicine

## 2010-09-24 ENCOUNTER — Ambulatory Visit (INDEPENDENT_AMBULATORY_CARE_PROVIDER_SITE_OTHER): Payer: BLUE CROSS/BLUE SHIELD | Admitting: Family Medicine

## 2010-09-24 VITALS — BP 150/90 | Temp 98.3°F | Wt 167.0 lb

## 2010-09-24 DIAGNOSIS — K219 Gastro-esophageal reflux disease without esophagitis: Secondary | ICD-10-CM

## 2010-09-24 DIAGNOSIS — Z0000 Encounter for general adult medical examination without abnormal findings: Secondary | ICD-10-CM

## 2010-09-24 DIAGNOSIS — R05 Cough: Secondary | ICD-10-CM

## 2010-09-24 DIAGNOSIS — R030 Elevated blood-pressure reading, without diagnosis of hypertension: Secondary | ICD-10-CM

## 2010-09-24 DIAGNOSIS — R42 Dizziness and giddiness: Secondary | ICD-10-CM

## 2010-09-24 LAB — POCT URINALYSIS DIPSTICK
Blood, UA: NEGATIVE
Protein, UA: NEGATIVE
Spec Grav, UA: 1.005
Urobilinogen, UA: 0.2

## 2010-09-24 NOTE — Progress Notes (Signed)
  Subjective:    Patient ID: Colleen Owen, female    DOB: 1955-10-25, 55 y.o.   MRN: [id number]  HPI Patient seen for this several following items   New symptom of vertigo. First noted this morning getting out of bed. Some associated nausea but no vomiting. Symptoms have improved through the day. No syncope or presyncope. Denies any hearing changes. No fever or chills. Denies any diplopia, focal weakness or ataxia. Symptoms exacerbated by movement.  Some recent increase in GERD symptoms. Some recent weight gain. History of nicotine use. Increased chocolate consumption. Occasional caffeine use.  Tums with some relief  Chronic cough for approximately 2 months. Long history of smoking. Some increased early-morning sputum production. Denies any fever, chills, hemoptysis, pleuritic pain, increased dyspnea over baseline,, increased wheezing, or any appetite or weight changes. No postnasal drip symptoms. Occasional GERD as above.  Elevated blood pressure. By home readings around 130/90. No headaches or dizziness. No regular exercise. No regular alcohol use.  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 Denies any burning with urination. Occasional frequency. No back pain. No fever or chills. Requesting repeat urine today  Review of Systems  Constitutional: Negative for fever, chills, appetite change, fatigue and unexpected weight change.  Respiratory: Positive for cough and shortness of breath. Negative for wheezing and stridor.   Cardiovascular: Negative for chest pain, palpitations and leg swelling.  Gastrointestinal: Negative for abdominal pain and blood in stool.  Genitourinary: Positive for frequency. Negative for dysuria, hematuria and flank pain.  Musculoskeletal: Positive for back pain.  Neurological: Positive for dizziness. Negative for seizures, syncope, weakness and headaches.  Hematological: Negative for adenopathy.         Objective:   Physical Exam  Constitutional: She is oriented to person, place, and time. She appears well-developed and well-nourished.  HENT:  Head: Normocephalic and atraumatic.  Right Ear: External ear normal.  Left Ear: External ear normal.  Mouth/Throat: No oropharyngeal exudate.  Neck: Neck supple. No thyromegaly present.  Cardiovascular: Normal rate, regular rhythm and normal heart sounds.   Pulmonary/Chest: Effort normal and breath sounds normal. No respiratory distress. She has no wheezes. She has no rales.       Slightly diminished breath sounds throughout but clear  Musculoskeletal: She exhibits no edema.  Lymphadenopathy:    She has no cervical adenopathy.  Neurological: She is alert and oriented to person, place, and time.       Cerebellar function normal. No focal strength deficits.  Skin: No rash noted.          Assessment & Plan:  #1 vertigo. Suspect benign positional vertigo. Reassurance given. Be in touch if persists or any new symptoms. Consider extinguishing exercises if persists #2 pyuria. Urine culture. No antibiotics at this time since she has no fever or urinary symptoms #3 GERD. Discussed lifestyle modification. Try over-the-counter H2 blocker initially #4 chronic cough. She is encouraged to stop smoking. Chest x-ray given duration of symptoms though she has no red flags. Patient needs spirometry. Suspect some early COPD #5 elevated blood pressure. Borderline elevation. Work on weight loss. Reassess 3 months

## 2010-09-26 LAB — URINE CULTURE: Colony Count: 9000

## 2010-09-26 NOTE — Progress Notes (Signed)
Quick Note:  Pt informed ______ 

## 2010-10-21 ENCOUNTER — Other Ambulatory Visit: Payer: Self-pay | Admitting: Family Medicine

## 2011-02-17 ENCOUNTER — Ambulatory Visit (INDEPENDENT_AMBULATORY_CARE_PROVIDER_SITE_OTHER): Payer: BC Managed Care – PPO | Admitting: Family Medicine

## 2011-02-17 ENCOUNTER — Encounter: Payer: Self-pay | Admitting: Family Medicine

## 2011-02-17 DIAGNOSIS — R05 Cough: Secondary | ICD-10-CM

## 2011-02-17 DIAGNOSIS — R079 Chest pain, unspecified: Secondary | ICD-10-CM

## 2011-02-17 NOTE — Progress Notes (Signed)
  Subjective:    Patient ID: Colleen Owen, female    DOB: 08-Mar-1956, 55 y.o.   MRN: [id number]  HPI Acute visit. Patient seen with somewhat atypical chest pain. Onset about one week ago. Particularly worse Saturday after eating bacon.  She describes relatively constant symptoms since then. Some substernal chest tightness and occasional burning which is improved after burping.   TookTUMS without relief. No exertional quality. No dyspnea. Also drinks about 2 glasses of wine per day which may be exacerbating.. No cardiac history. No arm or neck pain. Patient continues to smoke about one pack cigarettes per day.  Chronic cough for several months. No increase in dyspnea. No hemoptysis. No pleuritic pain. Denies appetite or weight changes. We have previously suggested chest x-ray but she has not gone yet.  Past Medical History  Diagnosis Date  . HYPOTHYROIDISM 09/08/2008  . ANXIETY 09/08/2008  . DEPRESSION 09/08/2008  . HYPERTENSION 09/08/2008  . Acute bronchitis 02/20/2009  . ELEVATED BLOOD PRESSURE 07/16/2009   Past Surgical History  Procedure Date  . Urethra surgery 2008    reports that she has been smoking Cigarettes.  She has a 67.5 pack-year smoking history. She does not have any smokeless tobacco history on file. Her alcohol and drug histories not on file. family history includes Alcohol abuse in her maternal grandfather; Cancer in her mother; Diabetes in her father; Hypertension in her mother; and Stroke in her father. Allergies  Allergen Reactions  . Codeine Sulfate     REACTION: GI upset  . Loratadine-Pseudoephedrine       Review of Systems  Constitutional: Negative for appetite change, fatigue and unexpected weight change.  Respiratory: Positive for cough. Negative for shortness of breath and wheezing.   Cardiovascular: Positive for chest pain. Negative for palpitations and leg swelling.  Gastrointestinal: Negative for abdominal pain.  Neurological: Negative for syncope, weakness and  headaches.  Hematological: Negative for adenopathy.       Objective:   Physical Exam  Constitutional: She is oriented to person, place, and time. She appears well-developed and well-nourished. No distress.  Neck: Neck supple. No thyromegaly present.  Cardiovascular: Normal rate and regular rhythm.   Pulmonary/Chest: Effort normal and breath sounds normal. No respiratory distress. She has no wheezes. She has no rales.  Abdominal: Soft. There is no tenderness.  Musculoskeletal: She exhibits no edema.  Neurological: She is alert and oriented to person, place, and time.          Assessment & Plan:  #1 atypical chest pain. Suspect related to GERD. Discussed lifestyle modification. EKG unremarkable. Samples Dexilant 1 daily and followup promptly if symptoms worsen or if they persist in the next few days. #2 chronic cough. Ongoing nicotine use. Chest x-ray ordered.  ?related to GERD.  No red flags such as weight loss, hemoptysis, etc.

## 2011-02-17 NOTE — Patient Instructions (Signed)
Dexilant one tablet daily Follow up if no improvement after one week. Follow up immediately for any exertional chest pain.

## 2011-02-18 ENCOUNTER — Ambulatory Visit (INDEPENDENT_AMBULATORY_CARE_PROVIDER_SITE_OTHER)
Admission: RE | Admit: 2011-02-18 | Discharge: 2011-02-18 | Disposition: A | Discharge status: Home / Self Care | Payer: BC Managed Care – PPO | Source: Ambulatory Visit | Attending: Family Medicine | Admitting: Family Medicine

## 2011-02-18 DIAGNOSIS — R05 Cough: Secondary | ICD-10-CM

## 2011-02-19 ENCOUNTER — Telehealth: Payer: Self-pay | Admitting: *Deleted

## 2011-02-19 NOTE — Progress Notes (Signed)
Quick Note:  Pt informed on VM ______ 

## 2011-02-19 NOTE — Telephone Encounter (Signed)
LMTCB

## 2011-02-19 NOTE — Telephone Encounter (Signed)
Pt. Is calling for chest xray results.

## 2011-02-19 NOTE — Telephone Encounter (Signed)
No acute finding

## 2011-02-20 NOTE — Telephone Encounter (Signed)
LMTCB again at pt's work.

## 2011-02-20 NOTE — Telephone Encounter (Signed)
Pt given chest xray results'  

## 2011-02-27 ENCOUNTER — Other Ambulatory Visit: Payer: Self-pay | Admitting: Family Medicine

## 2011-02-27 NOTE — Telephone Encounter (Signed)
Last filled 07-08-2010, #30 with 3 refills Sig: 1/2 tab daily as needed

## 2011-02-27 NOTE — Telephone Encounter (Signed)
Refill for 6 months. 

## 2011-02-28 NOTE — Telephone Encounter (Signed)
See note.  Already answered.  Refill for 6 months.

## 2011-03-06 MED ORDER — ALPRAZOLAM 0.5 MG PO TABS: ORAL_TABLET | ORAL | Status: DC

## 2011-03-24 ENCOUNTER — Telehealth: Payer: Self-pay | Admitting: *Deleted

## 2011-03-24 NOTE — Telephone Encounter (Signed)
Pt would like to have a stronger antidepressant than Prozac called to pharmacy.  She is going home for the holidays and thinks she will need it.

## 2011-03-25 NOTE — Telephone Encounter (Signed)
Notified. 

## 2011-03-25 NOTE — Telephone Encounter (Signed)
Office visit to discuss options. 

## 2011-03-31 ENCOUNTER — Ambulatory Visit (INDEPENDENT_AMBULATORY_CARE_PROVIDER_SITE_OTHER): Payer: BC Managed Care – PPO | Admitting: Family Medicine

## 2011-03-31 ENCOUNTER — Encounter: Payer: Self-pay | Admitting: Family Medicine

## 2011-03-31 VITALS — BP 112/84 | Temp 98.1°F | Wt 171.0 lb

## 2011-03-31 DIAGNOSIS — E039 Hypothyroidism, unspecified: Secondary | ICD-10-CM

## 2011-03-31 DIAGNOSIS — F329 Major depressive disorder, single episode, unspecified: Secondary | ICD-10-CM

## 2011-03-31 LAB — TSH: TSH: 0.81 u[IU]/mL (ref 0.35–5.50)

## 2011-03-31 NOTE — Patient Instructions (Signed)
Stop Prozac. Start Pristiq 50 mg once daily.

## 2011-03-31 NOTE — Progress Notes (Signed)
  Subjective:    Patient ID: Colleen Owen, female    DOB: 1955/05/26, 55 y.o.   MRN: [id number]  HPI  Evaluation depression. Long history of recurrent depression. Loss of both parents during past year. Has been present for several years and feels Prozac is not helping much at all. She wants to explore other options. Because of recent sleep disturbance. Low motivation. Depressed mood. No suicidal ideation.  History of hypothyroidism. Needs repeat TSH. Takes levothyroxine 125 mg daily. Compliant with therapy. No regular exercise. Weight stable. Appetite fair.  Some chronic fatigue.  Past Medical History  Diagnosis Date  . HYPOTHYROIDISM 09/08/2008  . ANXIETY 09/08/2008  . DEPRESSION 09/08/2008  . HYPERTENSION 09/08/2008  . Acute bronchitis 02/20/2009  . ELEVATED BLOOD PRESSURE 07/16/2009   Past Surgical History  Procedure Date  . Urethra surgery 2008    reports that she has been smoking Cigarettes.  She has a 67.5 pack-year smoking history. She does not have any smokeless tobacco history on file. Her alcohol and drug histories not on file. family history includes Alcohol abuse in her maternal grandfather; Cancer in her mother; Diabetes in her father; Hypertension in her mother; and Stroke in her father. Allergies  Allergen Reactions  . Codeine Sulfate     REACTION: GI upset  . Loratadine-Pseudoephedrine       Review of Systems  Constitutional: Positive for fatigue. Negative for chills, appetite change and unexpected weight change.  Respiratory: Negative for cough and shortness of breath.   Cardiovascular: Negative for chest pain, palpitations and leg swelling.  Gastrointestinal: Negative for abdominal pain.  Psychiatric/Behavioral: Positive for dysphoric mood. Negative for suicidal ideas, confusion, self-injury and agitation.       Objective:   Physical Exam  Constitutional: She is oriented to person, place, and time. She appears well-developed and well-nourished.  Cardiovascular:  Normal rate and regular rhythm.   Pulmonary/Chest: Effort normal and breath sounds normal. No respiratory distress. She has no wheezes. She has no rales.  Musculoskeletal: She exhibits no edema.  Neurological: She is alert and oriented to person, place, and time. No cranial nerve deficit.  Psychiatric: She has a normal mood and affect. Her behavior is normal.          Assessment & Plan:  #1 recurrent depression. Discontinue Prozac. Start Pristiq 50 mg 1 daily. Samples provided. Reassess 3-4 weeks and reviewed possible side effects #2 hypothyroidism. Recheck TSH.

## 2011-04-02 NOTE — Progress Notes (Signed)
Quick Note:  Pt informed on personally identified VM ______ 

## 2011-04-25 ENCOUNTER — Encounter: Payer: Self-pay | Admitting: Family Medicine

## 2011-04-25 ENCOUNTER — Ambulatory Visit (INDEPENDENT_AMBULATORY_CARE_PROVIDER_SITE_OTHER): Payer: BC Managed Care – PPO | Admitting: Family Medicine

## 2011-04-25 DIAGNOSIS — F329 Major depressive disorder, single episode, unspecified: Secondary | ICD-10-CM

## 2011-04-25 MED ORDER — DESVENLAFAXINE SUCCINATE ER 50 MG PO TB24: 50.0000 mg | ORAL_TABLET | Freq: Every day | ORAL | Status: DC

## 2011-04-25 NOTE — Progress Notes (Signed)
  Subjective:    Patient ID: Colleen Owen, female    DOB: [birth year], 55 y.o.   MRN: [id number]  HPI  Followup depression. Patient had been on Prozac for several years was having increasing problems with depression. We changed her to Pristiq and she has had a great response. Much less fatigue. Increased motivation. Less depressed mood. No side effects. She is very pleased with response. No suicidal ideation. Recent TSH normal. Patient takes thyroid replacement.   Review of Systems  Constitutional: Negative for appetite change and unexpected weight change.  Respiratory: Negative for shortness of breath.   Cardiovascular: Negative for chest pain.  Psychiatric/Behavioral: Negative for dysphoric mood and agitation.       Objective:   Physical Exam  Constitutional: She appears well-developed and well-nourished. No distress.  Cardiovascular: Normal rate and regular rhythm.   Pulmonary/Chest: Effort normal and breath sounds normal.  Psychiatric: She has a normal mood and affect. Her behavior is normal.          Assessment & Plan:  Chronic/recurrent depression improved on Pristiq. Refills given for 6 months and samples provided for one month.

## 2011-05-15 ENCOUNTER — Telehealth: Payer: Self-pay | Admitting: *Deleted

## 2011-05-15 NOTE — Telephone Encounter (Addendum)
Pt is asking to stop Pristiq and wants to know if she needs taper and how??

## 2011-05-15 NOTE — Telephone Encounter (Signed)
No need to taper

## 2011-05-15 NOTE — Telephone Encounter (Signed)
Pt aware.

## 2011-06-09 ENCOUNTER — Ambulatory Visit (INDEPENDENT_AMBULATORY_CARE_PROVIDER_SITE_OTHER): Payer: BC Managed Care – PPO | Admitting: Family Medicine

## 2011-06-09 ENCOUNTER — Encounter: Payer: Self-pay | Admitting: Family Medicine

## 2011-06-09 VITALS — BP 128/90 | Temp 98.1°F | Wt 171.0 lb

## 2011-06-09 DIAGNOSIS — J209 Acute bronchitis, unspecified: Secondary | ICD-10-CM

## 2011-06-09 MED ORDER — AZITHROMYCIN 250 MG PO TABS: ORAL_TABLET | ORAL | Status: AC

## 2011-06-09 NOTE — Progress Notes (Signed)
  Subjective:    Patient ID: Colleen Owen, female    DOB: 1955-05-29, 56 y.o.   MRN: [id number]  HPI  Acute visit. Respiratory illness which started last week. Progressively worse. Mostly cough.  Some dis- colored sputum. She works at a [employer], sports and started prednisone 5 mg daily and felt slightly better. Still smokes one half pack cigarettes per day. Some nasal congestion. No sore throat. No dyspnea.   Review of Systems  HENT: Positive for congestion. Negative for ear pain, sore throat and voice change.   Respiratory: Positive for cough. Negative for shortness of breath and wheezing.   Cardiovascular: Negative for chest pain.  Neurological: Negative for headaches.       Objective:   Physical Exam  Constitutional: She appears well-developed and well-nourished.  HENT:  Right Ear: External ear normal.  Left Ear: External ear normal.  Mouth/Throat: Oropharynx is clear and moist.  Neck: Neck supple.  Cardiovascular: Normal rate and regular rhythm.   Pulmonary/Chest: Effort normal and breath sounds normal. No respiratory distress. She has no wheezes. She has no rales.  Lymphadenopathy:    She has no cervical adenopathy.          Assessment & Plan:  Acute bronchitis. Patient encouraged to stop smoking. Drink plenty of fluids. If she notes any fever or worsening symptoms start Zithromax

## 2011-06-09 NOTE — Patient Instructions (Signed)

## 2011-07-28 ENCOUNTER — Telehealth: Payer: Self-pay | Admitting: *Deleted

## 2011-07-28 NOTE — Telephone Encounter (Signed)
Pt is asking if she can take the Osteobiflex with her other meds?

## 2011-07-28 NOTE — Telephone Encounter (Signed)
Notified pt. 

## 2011-07-28 NOTE — Telephone Encounter (Signed)
Yes

## 2011-08-20 ENCOUNTER — Other Ambulatory Visit: Payer: Self-pay | Admitting: Family Medicine

## 2011-08-20 NOTE — Telephone Encounter (Signed)
Patient called stating that she need a refill on her aprazolam early due to taking them day and night and she states she need 30 pills every month. Please advise.

## 2011-08-20 NOTE — Telephone Encounter (Signed)
Our sig on chart is 1/2 tab at HS prn Pt now reporting she takes 1/2 tab BID Last filled 03/06/11, #30 with 2 refills

## 2011-08-22 NOTE — Telephone Encounter (Signed)
Did we clarify if she was getting from multiple pharmacies or multiple providers?

## 2011-08-23 NOTE — Telephone Encounter (Signed)
Addended by: Melchor Amour on: 08/23/2011 01:25 PM   Modules accepted: Orders

## 2011-08-23 NOTE — Telephone Encounter (Signed)
I called pt pharmacy she had faxed a report of Xanax refills.  It appeared she was filling this med at another pharmacy.  The pharmacist determined she had 2 different Rx numbers in their system.  She cancelled [id number] and I authorized a refill on the [id number] ( which had one refill left).  Pt states Dr Caryl Never and her decided to take 1/2 tab every AM and another 1/2 tab PRN at HS.  The sig at the pharmacy was 1/2 tab every day as needed.  I corrected the sig for future refills as she needs 30 pills a month.

## 2011-08-24 NOTE — Telephone Encounter (Signed)
Refill for 3 months. 

## 2011-08-25 MED ORDER — ALPRAZOLAM 0.5 MG PO TABS: ORAL_TABLET | ORAL | Status: DC

## 2011-08-25 NOTE — Telephone Encounter (Signed)
Addended by: Melchor Amour on: 08/25/2011 11:46 AM   Modules accepted: Orders

## 2011-08-27 ENCOUNTER — Ambulatory Visit (INDEPENDENT_AMBULATORY_CARE_PROVIDER_SITE_OTHER): Payer: BC Managed Care – PPO | Admitting: Family Medicine

## 2011-08-27 ENCOUNTER — Encounter: Payer: Self-pay | Admitting: Family Medicine

## 2011-08-27 VITALS — BP 122/86 | Temp 99.0°F | Wt 168.0 lb

## 2011-08-27 DIAGNOSIS — F411 Generalized anxiety disorder: Secondary | ICD-10-CM

## 2011-08-27 DIAGNOSIS — E039 Hypothyroidism, unspecified: Secondary | ICD-10-CM

## 2011-08-27 MED ORDER — LEVOTHYROXINE SODIUM 125 MCG PO TABS: 125.0000 ug | ORAL_TABLET | Freq: Every day | ORAL | Status: DC

## 2011-08-27 NOTE — Progress Notes (Signed)
  Subjective:    Patient ID: Colleen Owen, female    DOB: February 29, 1956, 56 y.o.   MRN: [id number]  HPI  Patient here to discuss medications. She has taken alprazolam for several years with no history of misuse. Recently we received phone call from pharmacy that she had prescriptions with 2 different prescription numbers. She has never gotten her medication from more than one pharmacy or more than one provider. Apparently one of her prescription said to take alprazolam 0.5 mg one at night as needed and another prescription said to take one daily as needed. We discussed possibly increasing her alprazolam to 0.5 mg one half tablet in the morning and one half tablet at night as needed.   She has no history of misuse. She has not taken one half tablet twice daily consistently-sometimes takes once daily.  Hypothyroidism treated with levothyroxine. Requesting refills. Labs normal back in November  Past Medical History  Diagnosis Date  . HYPOTHYROIDISM 09/08/2008  . ANXIETY 09/08/2008  . DEPRESSION 09/08/2008  . HYPERTENSION 09/08/2008  . Acute bronchitis 02/20/2009  . ELEVATED BLOOD PRESSURE 07/16/2009   Past Surgical History  Procedure Date  . Urethra surgery 2008    reports that she has been smoking Cigarettes.  She has a 67.5 pack-year smoking history. She does not have any smokeless tobacco history on file. Her alcohol and drug histories not on file. family history includes Alcohol abuse in her maternal grandfather; Cancer in her mother; Diabetes in her father; Hypertension in her mother; and Stroke in her father. Allergies  Allergen Reactions  . Codeine Sulfate     REACTION: GI upset  . Loratadine-Pseudoephedrine     "Wired, cannot sleep"      Review of Systems  Respiratory: Negative for cough and shortness of breath.   Cardiovascular: Negative for chest pain.  Neurological: Negative for dizziness and syncope.  Psychiatric/Behavioral: Negative for dysphoric mood. The patient is  nervous/anxious.        Objective:   Physical Exam  Constitutional: She is oriented to person, place, and time. She appears well-developed and well-nourished.  Cardiovascular: Normal rate and regular rhythm.   Pulmonary/Chest: Effort normal and breath sounds normal. No respiratory distress. She has no wheezes. She has no rales.  Neurological: She is alert and oriented to person, place, and time. No cranial nerve deficit.  Psychiatric: She has a normal mood and affect. Her behavior is normal. Judgment and thought content normal.          Assessment & Plan:  Patient has history of chronic anxiety and insomnia. Using very low-dose alprazolam. No history of misuse. Her prescription has been changed to reflect dosing of 0.5 mg one half tablet in the morning and one half tablet at night as needed.  Hypothyroidism. Refilled the levothyroxine

## 2011-09-30 ENCOUNTER — Telehealth: Payer: Self-pay | Admitting: Family Medicine

## 2011-09-30 NOTE — Telephone Encounter (Signed)
Yes.  She should be on alprazolam 0.5 mg one half tablet bid.  Refill for 3 months.

## 2011-09-30 NOTE — Telephone Encounter (Signed)
Call placed to patient at [phone], patient stated that [facility]  Screwed up her Rx. She states she was given a new Rx at her office visit that was sent to pharmacy. She stated the pharmacist advised her to contact office. Rx refill for April was never received by pharmacy. Is it okay to refill medication as authorized at April Office visit?

## 2011-09-30 NOTE — Telephone Encounter (Signed)
Pt is still having issues with her medication. Pt can not receive a refill on her ALPRAZolam Prudy Feeler) 0.5 MG tablet until June 15th. Pt seemed confused about how she is supposed to be taking the medication, when asked what the script should be written for she said she is supposed to take 1/2 tab in the morning and 1/2 at night. This is what last script was sent in for. Please contact pt for clarification

## 2011-10-01 MED ORDER — ALPRAZOLAM 0.5 MG PO TABS: ORAL_TABLET | ORAL | Status: DC

## 2011-10-01 NOTE — Telephone Encounter (Signed)
Call placed to [facility] pharmacy at [phone], spoke with pharmacy clarification of new directions on xanax provided to pharmacist. Call placed to patient at [phone], she was informed of Rx correction to pharmacy.

## 2012-01-13 ENCOUNTER — Other Ambulatory Visit: Payer: Self-pay | Admitting: Family Medicine

## 2012-01-15 NOTE — Telephone Encounter (Signed)
Refill for 6 months. 

## 2012-01-15 NOTE — Telephone Encounter (Signed)
Alprazolam 1/2 in am, 1/2 in pm prn last filled 10-01-11, #30 with 2 refills

## 2012-01-16 MED ORDER — ALPRAZOLAM 0.5 MG PO TABS: ORAL_TABLET | ORAL | Status: DC

## 2012-02-17 ENCOUNTER — Encounter: Payer: Self-pay | Admitting: Family Medicine

## 2012-02-17 ENCOUNTER — Ambulatory Visit (INDEPENDENT_AMBULATORY_CARE_PROVIDER_SITE_OTHER): Payer: BC Managed Care – PPO | Admitting: Family Medicine

## 2012-02-17 VITALS — BP 124/88 | HR 108 | Temp 98.2°F | Wt 159.0 lb

## 2012-02-17 DIAGNOSIS — J329 Chronic sinusitis, unspecified: Secondary | ICD-10-CM

## 2012-02-17 MED ORDER — AZITHROMYCIN 250 MG PO TABS: ORAL_TABLET | ORAL | Status: DC

## 2012-02-17 NOTE — Progress Notes (Signed)
  Subjective:    Patient ID: Colleen Owen, female    DOB: 1955/11/24, 56 y.o.   MRN: [id number]  HPI Here for one week of sinus pressure, PND, blowing green mucus from the nose, and a dry cough. No fever.   Review of Systems  Constitutional: Negative.   HENT: Positive for congestion, postnasal drip and sinus pressure.   Eyes: Negative.   Respiratory: Positive for cough.        Objective:   Physical Exam  Constitutional: She appears well-developed and well-nourished.  HENT:  Right Ear: External ear normal.  Left Ear: External ear normal.  Nose: Nose normal.  Mouth/Throat: Oropharynx is clear and moist.  Eyes: Conjunctivae normal are normal.  Pulmonary/Chest: Effort normal and breath sounds normal.  Lymphadenopathy:    She has no cervical adenopathy.          Assessment & Plan:  Add Mucinex

## 2012-03-25 ENCOUNTER — Telehealth: Payer: Self-pay | Admitting: Family Medicine

## 2012-03-25 DIAGNOSIS — E039 Hypothyroidism, unspecified: Secondary | ICD-10-CM

## 2012-03-25 NOTE — Telephone Encounter (Signed)
Pt will be losing Ins end of December. Pt would like to have another tsh blood work before end of dec. Pt last tsh bloodwork was 03-2011. Can I sch?

## 2012-03-25 NOTE — Telephone Encounter (Signed)
Yes

## 2012-03-26 NOTE — Telephone Encounter (Signed)
Pt is sch for 03-29-2012

## 2012-03-29 ENCOUNTER — Other Ambulatory Visit (INDEPENDENT_AMBULATORY_CARE_PROVIDER_SITE_OTHER): Payer: BC Managed Care – PPO

## 2012-03-29 DIAGNOSIS — E039 Hypothyroidism, unspecified: Secondary | ICD-10-CM

## 2012-03-30 NOTE — Progress Notes (Signed)
Quick Note:  Pt informed on home VM ______ 

## 2012-04-09 ENCOUNTER — Other Ambulatory Visit: Payer: Self-pay

## 2012-05-30 ENCOUNTER — Observation Stay (HOSPITAL_COMMUNITY)
Admission: EM | Admit: 2012-05-30 | Discharge: 2012-05-31 | Disposition: A | Discharge status: Home / Self Care | Payer: BC Managed Care – PPO | Attending: Emergency Medicine | Admitting: Emergency Medicine

## 2012-05-30 ENCOUNTER — Emergency Department (HOSPITAL_COMMUNITY): Payer: BC Managed Care – PPO

## 2012-05-30 ENCOUNTER — Encounter (HOSPITAL_COMMUNITY): Payer: Self-pay | Admitting: Cardiology

## 2012-05-30 DIAGNOSIS — F17200 Nicotine dependence, unspecified, uncomplicated: Secondary | ICD-10-CM | POA: Insufficient documentation

## 2012-05-30 DIAGNOSIS — Z79899 Other long term (current) drug therapy: Secondary | ICD-10-CM | POA: Insufficient documentation

## 2012-05-30 DIAGNOSIS — R110 Nausea: Secondary | ICD-10-CM | POA: Insufficient documentation

## 2012-05-30 DIAGNOSIS — F3289 Other specified depressive episodes: Secondary | ICD-10-CM | POA: Insufficient documentation

## 2012-05-30 DIAGNOSIS — R911 Solitary pulmonary nodule: Secondary | ICD-10-CM

## 2012-05-30 DIAGNOSIS — F411 Generalized anxiety disorder: Secondary | ICD-10-CM | POA: Insufficient documentation

## 2012-05-30 DIAGNOSIS — R079 Chest pain, unspecified: Principal | ICD-10-CM | POA: Insufficient documentation

## 2012-05-30 DIAGNOSIS — F329 Major depressive disorder, single episode, unspecified: Secondary | ICD-10-CM | POA: Insufficient documentation

## 2012-05-30 DIAGNOSIS — M549 Dorsalgia, unspecified: Secondary | ICD-10-CM | POA: Insufficient documentation

## 2012-05-30 DIAGNOSIS — I10 Essential (primary) hypertension: Secondary | ICD-10-CM | POA: Insufficient documentation

## 2012-05-30 DIAGNOSIS — E039 Hypothyroidism, unspecified: Secondary | ICD-10-CM | POA: Insufficient documentation

## 2012-05-30 LAB — CBC
MCH: 34 pg (ref 26.0–34.0)
MCHC: 35.9 g/dL (ref 30.0–36.0)
MCV: 94.6 fL (ref 78.0–100.0)
Platelets: 232 10*3/uL (ref 150–400)
RDW: 12.1 % (ref 11.5–15.5)

## 2012-05-30 LAB — HEPATIC FUNCTION PANEL
Albumin: 4 g/dL (ref 3.5–5.2)
Alkaline Phosphatase: 82 U/L (ref 39–117)
Bilirubin, Direct: <0.1 mg/dL (ref 0.0–0.3)
Total Bilirubin: 0.5 mg/dL (ref 0.3–1.2)

## 2012-05-30 LAB — BASIC METABOLIC PANEL
Calcium: 9.8 mg/dL (ref 8.4–10.5)
Creatinine, Ser: 0.56 mg/dL (ref 0.50–1.10)
GFR calc non Af Amer: >90 mL/min (ref >90)
Sodium: 137 mEq/L (ref 135–145)

## 2012-05-30 LAB — POCT I-STAT TROPONIN I: Troponin i, poc: 0 ng/mL (ref 0.00–0.08)

## 2012-05-30 MED ORDER — MORPHINE SULFATE 4 MG/ML IJ SOLN
4.0000 mg | Freq: Once | INTRAMUSCULAR | Status: AC
  Administered 2012-05-30: 4 mg via INTRAVENOUS
  Filled 2012-05-30: qty 1

## 2012-05-30 MED ORDER — NITROGLYCERIN 0.4 MG SL SUBL
0.4000 mg | SUBLINGUAL_TABLET | SUBLINGUAL | Status: DC | PRN
  Administered 2012-05-30: 0.4 mg via SUBLINGUAL
  Filled 2012-05-30: qty 25

## 2012-05-30 MED ORDER — AMITRIPTYLINE HCL 25 MG PO TABS: 50.0000 mg | ORAL_TABLET | Freq: Every day | ORAL | Status: DC

## 2012-05-30 MED ORDER — NICOTINE 14 MG/24HR TD PT24
14.0000 mg | MEDICATED_PATCH | Freq: Once | TRANSDERMAL | Status: DC
  Administered 2012-05-30: 14 mg via TRANSDERMAL
  Filled 2012-05-30: qty 1

## 2012-05-30 MED ORDER — IOHEXOL 350 MG/ML SOLN
100.0000 mL | Freq: Once | INTRAVENOUS | Status: AC | PRN
  Administered 2012-05-30: 100 mL via INTRAVENOUS

## 2012-05-30 NOTE — ED Provider Notes (Signed)
Pt received from Dr. Ethelda Chick.  57yo F w/ h/o HTN and tobacco abuse presented to ED w/ mid-sternal CP w/ radiation through to back.  EKG, labs and CXR unremarkable.  CTA chest ordered to r/o aortic dissection and shows right upper lobe pulmonary nodule and likely incidental finding of LUQ inflammation.  All results discussed w/ pt and I advised f/u with her PCP for lung nodule.  On repeat exam, VSS, chest/back non-tender, and there is slight periumbilical tenderness.  Hepatic function panel and lipase have been ordered but I doubt pancreatitis.  Pt may have gastritis/gastric ulcer but this also seems unlikely based on characteristics of pain and lack of epigastric tenderness.  Pt will be placed on CP protocol and have a treadmill stress in am.  She c/o very mild tightness in her chest currently.  A second dose of IV morphine ordered.    Pt reported improvement in pain w/ morphine but it did not resolve.  I then treated her w/ 3 SL ntg and she is now pain-free.  Will stay in Pod C overnight.   12:05 AM   Otilio Miu, PA-C 05/31/12 0005  Pt checked out to Dr. Silverio Lay. 7:19 AM     Otilio Miu, PA-C 05/31/12 [phone]

## 2012-05-30 NOTE — ED Notes (Signed)
Pt reports midsternal chest pain that radiates into her back, also reports nausea. States SOB with initial pain. Took 325mg  ASA and reports some relief from pain. Pain 3/10 at this time, but 10/10 when it first started.

## 2012-05-30 NOTE — ED Notes (Signed)
Family and IV team at bedside.

## 2012-05-30 NOTE — ED Provider Notes (Signed)
History     CSN: [id number]  Arrival date & time 05/30/12  1617   First MD Initiated Contact with Patient 05/30/12 1800      Chief Complaint  Patient presents with  . Chest Pain    (Consider location/radiation/quality/duration/timing/severity/associated sxs/prior treatment) HPI Complains of anterior substernal chest pain radiating to her onset approximately 11 AM today. Patient treated herself with one aspirin 325 mg tablet with partial relief. Presently discomfort is minimal predominantly at her upper back. Pain is not made better or worse by anything associated symptoms include nausea no shortness of breath no sweatiness. Pain was slightly improved when she got up" moved around". Cardiac risk factors include smoker hypertension otherwise negative Past Medical History  Diagnosis Date  . HYPOTHYROIDISM 09/08/2008  . ANXIETY 09/08/2008  . DEPRESSION 09/08/2008  . HYPERTENSION 09/08/2008  . Acute bronchitis 02/20/2009  . ELEVATED BLOOD PRESSURE 07/16/2009    Past Surgical History  Procedure Date  . Urethra surgery 2008    Family History  Problem Relation Age of Onset  . Cancer Mother     cancer  . Hypertension Mother   . Diabetes Father   . Stroke Father   . Alcohol abuse Maternal Grandfather     History  Substance Use Topics  . Smoking status: Current Every Day Smoker -- 1.5 packs/day for 45 years    Types: Cigarettes  . Smokeless tobacco: Never Used  . Alcohol Use: 3.5 oz/week    7 drink(s) per week    OB History    Grav Para Term Preterm Abortions TAB SAB Ect Mult Living                  Review of Systems  Constitutional: Negative.   HENT: Negative.   Respiratory: Negative.   Cardiovascular: Positive for chest pain.  Gastrointestinal: Positive for nausea.  Musculoskeletal: Negative.   Skin: Negative.   Neurological: Negative.   Hematological: Negative.   Psychiatric/Behavioral: Negative.   All other systems reviewed and are negative.    Allergies    Codeine sulfate and Loratadine-pseudoephedrine er  Home Medications   Current Outpatient Rx  Name  Route  Sig  Dispense  Refill  . ALPRAZOLAM 0.5 MG PO TABS   Oral   Take 0.125-0.25 mg by mouth See admin instructions. 0.25mg  (1/2 tab) in the am & if needed will take 0.125mg  (1/4 tab) in the pm for anxiety         . AMITRIPTYLINE HCL 50 MG PO TABS   Oral   Take 75 mg by mouth at bedtime.          Marland Kitchen CALCIPOTRIENE-BETAMETH DIPROP 0.005-0.064 % EX OINT   Topical   Apply 1 application topically at bedtime.         Marland Kitchen CALCIUM CARBONATE 600 MG PO TABS   Oral   Take 600 mg by mouth daily.          . OMEGA-3 FATTY ACIDS 1000 MG PO CAPS   Oral   Take 1 g by mouth daily.          Marland Kitchen LEVOTHYROXINE SODIUM 125 MCG PO TABS   Oral   Take 1 tablet (125 mcg total) by mouth daily.   90 tablet   3   . MULTI-VITAMIN/MINERALS PO TABS   Oral   Take 1 tablet by mouth daily.             BP 158/101  Pulse 96  Temp 98.2 F (36.8 C) (Oral)  Resp 13  SpO2 97%  Physical Exam  Nursing note and vitals reviewed. Constitutional: She appears well-developed and well-nourished.  HENT:  Head: Normocephalic and atraumatic.  Eyes: Conjunctivae normal are normal. Pupils are equal, round, and reactive to light.  Neck: Neck supple. No tracheal deviation present. No thyromegaly present.  Cardiovascular: Normal rate and regular rhythm.   No murmur heard. Pulmonary/Chest: Effort normal and breath sounds normal.  Abdominal: Soft. Bowel sounds are normal. She exhibits no distension. There is no tenderness.  Musculoskeletal: Normal range of motion. She exhibits no edema and no tenderness.  Neurological: She is alert. Coordination normal.  Skin: Skin is warm and dry. No rash noted.  Psychiatric: She has a normal mood and affect.    ED Course  Procedures (including critical care time)  Labs Reviewed  CBC - Abnormal; Notable for the following:    Hemoglobin 15.2 (*)     All other  components within normal limits  BASIC METABOLIC PANEL - Abnormal; Notable for the following:    Glucose, Bld 123 (*)     All other components within normal limits  POCT I-STAT TROPONIN I  CBC   No results found.   No diagnosis found.    Date: 05/30/2012  Rate: 100  Rhythm: sinus tachycardia  QRS Axis: normal  Intervals: normal  ST/T Wave abnormalities: normal  Conduction Disutrbances:none  Narrative Interpretation:   Old EKG Reviewed: Tracing from 02/17/2011 shows normal sinus rhythm 80 beats per minute within normal limits interpreted by me Results for orders placed during the hospital encounter of 05/30/12  CBC      Component Value Range   WBC 10.2  4.0 - 10.5 K/uL   RBC 4.47  3.87 - 5.11 MIL/uL   Hemoglobin 15.2 (*) 12.0 - 15.0 g/dL   HCT 16.1  09.6 - 04.5 %   MCV 94.6  78.0 - 100.0 fL   MCH 34.0  26.0 - 34.0 pg   MCHC 35.9  30.0 - 36.0 g/dL   RDW 40.9  81.1 - 91.4 %   Platelets 232  150 - 400 K/uL  BASIC METABOLIC PANEL      Component Value Range   Sodium 137  135 - 145 mEq/L   Potassium 3.9  3.5 - 5.1 mEq/L   Chloride 101  96 - 112 mEq/L   CO2 23  19 - 32 mEq/L   Glucose, Bld 123 (*) 70 - 99 mg/dL   BUN 13  6 - 23 mg/dL   Creatinine, Ser 7.82  0.50 - 1.10 mg/dL   Calcium 9.8  8.4 - 95.6 mg/dL   GFR calc non Af Amer >90  >90 mL/min   GFR calc Af Amer >90  >90 mL/min  POCT I-STAT TROPONIN I      Component Value Range   Troponin i, poc 0.00  0.00 - 0.08 ng/mL   Comment 3           chest xray reviewed by me  Dg Chest 2 View  05/30/2012  *RADIOLOGY REPORT*  Clinical Data: Chest pain shortness of breath.  CHEST - 2 VIEW  Comparison: PA and lateral chest 02/18/2011 and CT chest 02/13/2006.  Findings: The lungs are emphysematous but clear.  Heart size is normal.  No pneumothorax or pleural fluid.  IMPRESSION: Emphysema without acute disease.   Original Report Authenticated By: Holley Dexter, M.D.     1940 p.m. pain improved after treatment with morphine but  not gone. Additional morphine ordered  MDM  CT scans chest ordered to rule out aortic dissection. If CT scan negative patient is relatively low risk for acute coronary syndrome with heart score 3. Her BMI is 26.6. I feel she is a good candidate for chest pain rule out protocol Patient signed out toMs Schinlever at 8:50 PM in conjunction with Dr.Nanavanti Diagnosis #1 chest pain  #2 tobacco abuse        Doug Sou, MD 05/30/12 2109

## 2012-05-30 NOTE — ED Notes (Signed)
Morrie Sheldon in CT notified that IV team unable to get 20 g in [facility] or higher for CT. Dr. Ethelda Chick has spoken to CT about using current IV for testing.

## 2012-05-31 DIAGNOSIS — R072 Precordial pain: Secondary | ICD-10-CM

## 2012-05-31 LAB — TROPONIN I: Troponin I: <0.3 ng/mL (ref <0.30)

## 2012-05-31 NOTE — ED Provider Notes (Signed)
Care assumed at sign out. Colleen Owen is a 57 y.o. female smoker here with CP. Now pain free. CT angio chest showed no dissection or PE yesterday. ? Pancreatitis but patient denies ab pain and lipase nl. Stress echo this AM was limited but within normal limits. I recommend she f/u with PMD and be referred for cardiology eval. She should stop smoking and needs a f/u chest CT in 6 months.   Results for orders placed during the hospital encounter of 05/30/12  CBC      Component Value Range   WBC 10.2  4.0 - 10.5 K/uL   RBC 4.47  3.87 - 5.11 MIL/uL   Hemoglobin 15.2 (*) 12.0 - 15.0 g/dL   HCT 96.0  45.4 - 09.8 %   MCV 94.6  78.0 - 100.0 fL   MCH 34.0  26.0 - 34.0 pg   MCHC 35.9  30.0 - 36.0 g/dL   RDW 11.9  14.7 - 82.9 %   Platelets 232  150 - 400 K/uL  BASIC METABOLIC PANEL      Component Value Range   Sodium 137  135 - 145 mEq/L   Potassium 3.9  3.5 - 5.1 mEq/L   Chloride 101  96 - 112 mEq/L   CO2 23  19 - 32 mEq/L   Glucose, Bld 123 (*) 70 - 99 mg/dL   BUN 13  6 - 23 mg/dL   Creatinine, Ser 5.62  0.50 - 1.10 mg/dL   Calcium 9.8  8.4 - 13.0 mg/dL   GFR calc non Af Amer >90  >90 mL/min   GFR calc Af Amer >90  >90 mL/min  POCT I-STAT TROPONIN I      Component Value Range   Troponin i, poc 0.00  0.00 - 0.08 ng/mL   Comment 3           LIPASE, BLOOD      Component Value Range   Lipase 41  11 - 59 U/L  HEPATIC FUNCTION PANEL      Component Value Range   Total Protein 7.2  6.0 - 8.3 g/dL   Albumin 4.0  3.5 - 5.2 g/dL   AST 21  0 - 37 U/L   ALT 18  0 - 35 U/L   Alkaline Phosphatase 82  39 - 117 U/L   Total Bilirubin 0.5  0.3 - 1.2 mg/dL   Bilirubin, Direct <8.6  0.0 - 0.3 mg/dL   Indirect Bilirubin NOT CALCULATED  0.3 - 0.9 mg/dL  TROPONIN I      Component Value Range   Troponin I <0.30  <0.30 ng/mL   Dg Chest 2 View  05/30/2012  *RADIOLOGY REPORT*  Clinical Data: Chest pain shortness of breath.  CHEST - 2 VIEW  Comparison: PA and lateral chest 02/18/2011 and CT chest  02/13/2006.  Findings: The lungs are emphysematous but clear.  Heart size is normal.  No pneumothorax or pleural fluid.  IMPRESSION: Emphysema without acute disease.   Original Report Authenticated By: Holley Dexter, M.D.    Ct Angio Chest Pe W/cm &/or Wo Cm  05/30/2012  *RADIOLOGY REPORT*  Clinical Data: 57 year old female with chest pain.  CT ANGIOGRAPHY CHEST  Technique:  Multidetector CT imaging of the chest using the standard protocol during bolus administration of intravenous contrast. Multiplanar reconstructed images including MIPs were obtained and reviewed to evaluate the vascular anatomy.  Contrast: OMNIPAQUE IOHEXOL 350 MG/ML SOLN  Comparison: 05/30/2012 chest radiograph and 02/13/2006 chest CT  Findings:  This is a technically satisfactory study.  No pulmonary emboli are identified.  There is no evidence of thoracic aortic aneurysm. Upper limits normal heart size again noted.  No pleural or pericardial effusions are present. No enlarged lymph nodes are identified.  Mild to moderate centrilobular and paraseptal emphysema is identified. Mild dependent atelectasis/scarring is noted.  A 5 mm right upper lobe noncalcified nodule (image 23) is new since 2007.  There is no evidence of airspace disease, consolidation, mass or endobronchial/endotracheal lesion.  No acute or suspicious bony abnormalities identified.  Inflammation within the posterior upper left abdomen is noted, of uncertain origin but adjacent to the proximal stomach and pancreatic body.  IMPRESSION: Posterior upper left abdominal inflammation of uncertain origin but adjacent to the proximal stomach and pancreatic body.  This may be related to a gastric abnormality/ulcer or pancreatitis.  No evidence of pulmonary emboli.  5 mm right upper lobe pulmonary nodule - new since 2007. If the patient is at high risk for bronchogenic carcinoma, follow-up chest CT at 6-12 months is recommended.  If the patient is at low risk for bronchogenic  carcinoma, follow-up chest CT at 12 months is recommended.  This recommendation follows the consensus statement: Guidelines for Management of Small Pulmonary Nodules Detected on CT Scans: A Statement from the Fleischner Society as published in Radiology 2005; 237:395-400.  Mild to moderate centrilobular emphysema.   Original Report Authenticated By: Harmon Pier, M.D.        Richardean Canal, MD 05/31/12 [phone]

## 2012-05-31 NOTE — Progress Notes (Signed)
  Echocardiogram Echocardiogram Stress Test has been performed.  Colleen Owen 05/31/2012, 11:00 AM

## 2012-05-31 NOTE — ED Notes (Signed)
Lab in to draw 2nd troponin.   

## 2012-05-31 NOTE — ED Notes (Signed)
Dr Silverio Lay in to speak with pt about her results

## 2012-05-31 NOTE — ED Notes (Signed)
Called echo and let them know labs resulted . They will send for pt.

## 2012-05-31 NOTE — ED Notes (Signed)
PER ECHO TECH SOME OF THE TESTING MAY NEED TO BE REPEATED. STATES IMAGES HAVE BEEN SENT AND THE READING DOCTOR MAY WANT MORE IMMAGES WITH HER HEART RATE UP. PT WAS ABLE TO ACHIEVE [employer] HR.

## 2012-05-31 NOTE — ED Notes (Signed)
ekg completed for exam 

## 2012-06-01 NOTE — ED Provider Notes (Signed)
Medical screening examination/treatment/procedure(s) were conducted as a shared visit with non-physician practitioner(s) and myself.  I personally evaluated the patient during the encounter  Fatisha Rabalais, MD 06/01/12 0727 

## 2012-06-02 ENCOUNTER — Telehealth: Payer: Self-pay | Admitting: Family Medicine

## 2012-06-02 DIAGNOSIS — R911 Solitary pulmonary nodule: Secondary | ICD-10-CM

## 2012-06-02 NOTE — Telephone Encounter (Signed)
Patient calls to say she went to ER on 05/29/12 for chest pain.  He was told to follow up with Dr. Docia Furl regarding the need for referrals or further studies.  Patient stated currently is was doing ok- declined triage and scheduling of follow up appointment.  Caller is concerned about nodule that was noted in ER and its comparison to previous ones that Dr. Docia Furl had ordered.  He is calling to ask if his provider has reviewed them yet and if the nodule has changed any .  Last xray studies noted on the chart was. Previous studies noted of chest with or without contrast are dated 02/17/12, 05/22/2006, 02/13/2006, and 11/04/2005.  Patient is concerned because he is currently without insurance.  Please follow up with patient when possible.

## 2012-06-03 ENCOUNTER — Telehealth: Payer: Self-pay | Admitting: *Deleted

## 2012-06-03 NOTE — Telephone Encounter (Signed)
Pt informed on VM repeat CT chest in 6 months has been ordered.

## 2012-06-03 NOTE — Telephone Encounter (Signed)
After receiving VM re: Dr Lucie Leather message to return in 6 months for chest CT, patient called back and said because she has no insurance she probably will not have that done.  What she wanted him to do was compare her most recent CXR to the earlier one to see if nodule had changed at all.

## 2012-06-03 NOTE — Telephone Encounter (Signed)
Recommendation is for 6 months follow up CT chest with contrast to further evaluate.  CXR in 6 months would not be sufficient since these nodules frequently don't show up on regular x-ray.  We should go ahead and schedule 6 month follow up.

## 2012-06-03 NOTE — Telephone Encounter (Signed)
Her nodule showed up on CT and not CXR so we cannot make any comparisons CXR to CXR regarding any nodules.

## 2012-06-04 NOTE — Telephone Encounter (Signed)
Pt informed and voiced understanding

## 2012-06-15 ENCOUNTER — Telehealth: Payer: Self-pay | Admitting: Family Medicine

## 2012-06-15 NOTE — Telephone Encounter (Signed)
Patient called stating that her heart was racing and she was dizzy and feeling bad. Dr. Clent Ridges went over symptoms and reviewed her symptoms and advised ED.

## 2012-08-06 ENCOUNTER — Other Ambulatory Visit: Payer: Self-pay | Admitting: Family Medicine

## 2012-08-06 NOTE — Telephone Encounter (Signed)
Alprazolam 1/2 tab in AM, 1/2 tab in PM prn, last filled 01-16-12, #90 with 0 refills

## 2012-08-09 NOTE — Telephone Encounter (Signed)
Refill once.  I have not seen her in one year.  Needs office follow up.

## 2012-08-11 ENCOUNTER — Other Ambulatory Visit: Payer: Self-pay | Admitting: Family Medicine

## 2012-10-15 ENCOUNTER — Ambulatory Visit (INDEPENDENT_AMBULATORY_CARE_PROVIDER_SITE_OTHER): Payer: BC Managed Care – PPO | Admitting: Family Medicine

## 2012-10-15 ENCOUNTER — Encounter: Payer: Self-pay | Admitting: Family Medicine

## 2012-10-15 VITALS — BP 138/98 | Temp 98.2°F | Wt 164.0 lb

## 2012-10-15 DIAGNOSIS — E039 Hypothyroidism, unspecified: Secondary | ICD-10-CM

## 2012-10-15 DIAGNOSIS — R030 Elevated blood-pressure reading, without diagnosis of hypertension: Secondary | ICD-10-CM

## 2012-10-15 DIAGNOSIS — F411 Generalized anxiety disorder: Secondary | ICD-10-CM

## 2012-10-15 NOTE — Patient Instructions (Addendum)
Let me know if you change your mind regarding getting repeat CT scan of chest.

## 2012-10-16 NOTE — Progress Notes (Signed)
  Subjective:    Patient ID: Colleen Owen, female    DOB: 28-Dec-1955, 57 y.o.   MRN: [id number]  HPI Pt here for routine follow up .  Hypothyroidism.  Compliant with therapy.  No symptoms of hypothyroidism.  Last TSH reviewed and normal range.  History of elevated blood pressure. Currently not treated.  Lately at work around 140-148 /90s range.  No headache, dizziness, chest pain.  She has scaled back ETOH intake.  Chronic insomnia.  Takes elavil and low dose alprazolam.  No changes. Denies depression. No medication side effects.  Past Medical History  Diagnosis Date  . HYPOTHYROIDISM 09/08/2008  . ANXIETY 09/08/2008  . DEPRESSION 09/08/2008  . HYPERTENSION 09/08/2008  . Acute bronchitis 02/20/2009  . ELEVATED BLOOD PRESSURE 07/16/2009   Past Surgical History  Procedure Laterality Date  . Urethra surgery  2008    reports that she has been smoking Cigarettes.  She has a 67.5 pack-year smoking history. She has never used smokeless tobacco. She reports that she drinks about 3.5 ounces of alcohol per week. She reports that she does not use illicit drugs. family history includes Alcohol abuse in her maternal grandfather; Cancer in her mother; Diabetes in her father; Hypertension in her mother; and Stroke in her father. Allergies  Allergen Reactions  . Codeine Sulfate Nausea And Vomiting  . Loratadine-Pseudoephedrine Er     "Wired, cannot sleep"     Review of Systems  Constitutional: Negative for fatigue and unexpected weight change.  Eyes: Negative for visual disturbance.  Respiratory: Negative for cough, chest tightness, shortness of breath and wheezing.   Cardiovascular: Negative for chest pain, palpitations and leg swelling.  Gastrointestinal: Negative for abdominal pain.  Endocrine: Negative for polydipsia and polyuria.  Genitourinary: Negative for dysuria.  Neurological: Negative for dizziness, seizures, syncope, weakness, light-headedness and headaches.  Psychiatric/Behavioral:  Negative for dysphoric mood.       Objective:   Physical Exam  Constitutional: She appears well-developed and well-nourished.  HENT:  Mouth/Throat: Oropharynx is clear and moist.  Neck: Neck supple. No thyromegaly present.  Cardiovascular: Normal rate and regular rhythm.   Pulmonary/Chest: Effort normal and breath sounds normal. No respiratory distress. She has no wheezes. She has no rales.  Musculoskeletal: She exhibits no edema.  Psychiatric: She has a normal mood and affect. Her behavior is normal.          Assessment & Plan:  Hypothyroidism.  Stable.  Continue current meds and repeat TSH 6 months. Elevated blood pressure.  Pt reluctant to start meds yet.  She prefers weight loss and monitor.  Will get checked at work. Chronic insomnia. Sleep hygiene discussed.

## 2012-11-15 ENCOUNTER — Other Ambulatory Visit: Payer: Self-pay | Admitting: Family Medicine

## 2012-11-15 NOTE — Telephone Encounter (Signed)
Need approval on the Xanax

## 2012-11-16 ENCOUNTER — Other Ambulatory Visit: Payer: Self-pay

## 2012-11-16 ENCOUNTER — Other Ambulatory Visit: Payer: Self-pay | Admitting: Family Medicine

## 2012-11-16 MED ORDER — ALPRAZOLAM 0.5 MG PO TABS: ORAL_TABLET | ORAL | Status: DC

## 2012-11-16 NOTE — Telephone Encounter (Signed)
Refill for 6 months. 

## 2013-02-16 ENCOUNTER — Other Ambulatory Visit: Payer: BC Managed Care – PPO

## 2013-05-18 ENCOUNTER — Emergency Department (HOSPITAL_COMMUNITY): Payer: BC Managed Care – PPO

## 2013-05-18 ENCOUNTER — Encounter (HOSPITAL_COMMUNITY): Payer: Self-pay | Admitting: Emergency Medicine

## 2013-05-18 ENCOUNTER — Emergency Department (HOSPITAL_COMMUNITY)
Admission: EM | Admit: 2013-05-18 | Discharge: 2013-05-18 | Disposition: A | Discharge status: Home / Self Care | Payer: BC Managed Care – PPO | Attending: Emergency Medicine | Admitting: Emergency Medicine

## 2013-05-18 DIAGNOSIS — Y929 Unspecified place or not applicable: Secondary | ICD-10-CM | POA: Insufficient documentation

## 2013-05-18 DIAGNOSIS — F411 Generalized anxiety disorder: Secondary | ICD-10-CM | POA: Insufficient documentation

## 2013-05-18 DIAGNOSIS — E039 Hypothyroidism, unspecified: Secondary | ICD-10-CM | POA: Insufficient documentation

## 2013-05-18 DIAGNOSIS — Y9301 Activity, walking, marching and hiking: Secondary | ICD-10-CM | POA: Insufficient documentation

## 2013-05-18 DIAGNOSIS — F329 Major depressive disorder, single episode, unspecified: Secondary | ICD-10-CM | POA: Insufficient documentation

## 2013-05-18 DIAGNOSIS — W010XXA Fall on same level from slipping, tripping and stumbling without subsequent striking against object, initial encounter: Secondary | ICD-10-CM | POA: Insufficient documentation

## 2013-05-18 DIAGNOSIS — S62109A Fracture of unspecified carpal bone, unspecified wrist, initial encounter for closed fracture: Secondary | ICD-10-CM | POA: Insufficient documentation

## 2013-05-18 DIAGNOSIS — S62101A Fracture of unspecified carpal bone, right wrist, initial encounter for closed fracture: Secondary | ICD-10-CM

## 2013-05-18 DIAGNOSIS — I10 Essential (primary) hypertension: Secondary | ICD-10-CM | POA: Insufficient documentation

## 2013-05-18 DIAGNOSIS — Z8709 Personal history of other diseases of the respiratory system: Secondary | ICD-10-CM | POA: Insufficient documentation

## 2013-05-18 DIAGNOSIS — F17200 Nicotine dependence, unspecified, uncomplicated: Secondary | ICD-10-CM | POA: Insufficient documentation

## 2013-05-18 DIAGNOSIS — Z79899 Other long term (current) drug therapy: Secondary | ICD-10-CM | POA: Insufficient documentation

## 2013-05-18 DIAGNOSIS — F3289 Other specified depressive episodes: Secondary | ICD-10-CM | POA: Insufficient documentation

## 2013-05-18 NOTE — ED Provider Notes (Signed)
CSN: [id number]     Arrival date & time 05/18/13  1441 History  This chart was scribed for non-physician practitioner, Izola Price. Marisue Humble, PA-C working with Rolland Porter, MD by Greggory Stallion, ED scribe. This patient was seen in room WTR7/WTR7 and the patient's care was started at 3:26 PM.   Chief Complaint  Patient presents with  . Wrist Injury   The history is provided by the patient. No language interpreter was used.   HPI Comments: Colleen Owen is a 58 y.o. female who presents to the Emergency Department complaining of a fall that occurred this morning. She states she was walking her dogs and slipped on some ice, landing on her right wrist. Pt has sudden onset right wrist pain with associated swelling. Pt is right hand dominant.   Past Medical History  Diagnosis Date  . HYPOTHYROIDISM 09/08/2008  . ANXIETY 09/08/2008  . DEPRESSION 09/08/2008  . HYPERTENSION 09/08/2008  . Acute bronchitis 02/20/2009  . ELEVATED BLOOD PRESSURE 07/16/2009   Past Surgical History  Procedure Laterality Date  . Urethra surgery  2008   Family History  Problem Relation Age of Onset  . Cancer Mother     cancer  . Hypertension Mother   . Diabetes Father   . Stroke Father   . Alcohol abuse Maternal Grandfather    History  Substance Use Topics  . Smoking status: Current Every Day Smoker -- 1.50 packs/day for 45 years    Types: Cigarettes  . Smokeless tobacco: Never Used  . Alcohol Use: 3.5 oz/week    7 drink(s) per week   OB History   Grav Para Term Preterm Abortions TAB SAB Ect Mult Living                 Review of Systems  Musculoskeletal: Positive for arthralgias and joint swelling.  All other systems reviewed and are negative.    Allergies  Codeine sulfate and Loratadine-pseudoephedrine er  Home Medications   Current Outpatient Rx  Name  Route  Sig  Dispense  Refill  . ALPRAZolam (XANAX) 0.5 MG tablet      TAKE 1/2 TABLET BY MOUTH IN THE MORNING, 1/2 TABLET IN THE EVENING AT BEDTIME AS  NEEDED.   90 tablet   1   . amitriptyline (ELAVIL) 50 MG tablet   Oral   Take 75 mg by mouth at bedtime.          . calcium carbonate (OS-CAL) 600 MG TABS   Oral   Take 600 mg by mouth daily.          . fish oil-omega-3 fatty acids 1000 MG capsule   Oral   Take 1 g by mouth daily.          Marland Kitchen levothyroxine (SYNTHROID, LEVOTHROID) 125 MCG tablet      TAKE ONE TABLET BY MOUTH DAILY   90 tablet   3   . Multiple Vitamins-Minerals (MULTIVITAMIN WITH MINERALS) tablet   Oral   Take 1 tablet by mouth daily.            BP 149/93  Pulse 111  Temp(Src) 98.1 F (36.7 C) (Oral)  Resp 18  SpO2 96%  Physical Exam  Nursing note and vitals reviewed. Constitutional: She is oriented to person, place, and time. She appears well-developed and well-nourished. No distress.  HENT:  Head: Normocephalic and atraumatic.  Eyes: Conjunctivae are normal. No scleral icterus.  Pulmonary/Chest: Effort normal.  Musculoskeletal:       Right wrist:  She exhibits tenderness, bony tenderness and swelling. She exhibits normal range of motion and no deformity.       Arms:      Right hand: She exhibits tenderness. She exhibits normal range of motion and no deformity. Normal sensation noted. Normal strength noted. She exhibits no finger abduction, no thumb/finger opposition and no wrist extension trouble.       Hands: Neurological: She is alert and oriented to person, place, and time. She exhibits normal muscle tone. Coordination normal.  Skin: Skin is warm and dry. No rash noted. No erythema. No pallor.  Psychiatric: She has a normal mood and affect. Her behavior is normal. Judgment and thought content normal.    ED Course  Procedures (including critical care time)  DIAGNOSTIC STUDIES: Oxygen Saturation is 96% on RA, normal by my interpretation.    COORDINATION OF CARE: 3:30 PM-Discussed treatment plan which includes splint and follow up with a hand surgeon with pt at bedside and pt agreed to  plan.   Labs Review Labs Reviewed - No data to display Imaging Review Dg Wrist Complete Right  05/18/2013   CLINICAL DATA:  Fall.  Pain  EXAM: RIGHT WRIST - COMPLETE 3+ VIEW  COMPARISON:  05/18/2013  FINDINGS: The bones appear osteopenic. There is a osseous structure along the dorsum of the wrist which is concerning for triquetrum fracture. Overlying soft tissue swelling is noted. No dislocations.  IMPRESSION: Suspect triquetrum fracture. Correlation with exact sided tenderness is advised.   Electronically Signed   By: Signa Kellaylor  Stroud M.D.   On: 05/18/2013 15:18   Dg Hand Complete Right  05/18/2013   CLINICAL DATA:  58 year old female status post fall with pain. Initial encounter.  EXAM: RIGHT HAND - COMPLETE 3+ VIEW  COMPARISON:  None.  FINDINGS: Osteopenia.  Distal radius and ulna appear intact.  There is some dorsal soft tissue swelling at the wrist, and there are small irregular ossific fragments dorsal to the carpal bones. No other carpal bone fracture or dislocation.  Metacarpals appear intact ; mild osseous irregularity near the base of the fifth metacarpal is felt to be degenerative. Fourth finger ring artifact. Phalanges appear intact.  IMPRESSION: 1. Appearance compatible with acute triquetrum fracture. 2. No other acute fracture identified about the right hand.   Electronically Signed   By: Augusto GambleLee  Hall M.D.   On: 05/18/2013 15:15    EKG Interpretation   None       MDM  Right wrist fracture  Patient here with right wrist fracture, placed in volar splint and will follow up with Dr. Melvyn Novasrtmann on an outpatient basis.  Patient refuses pain control at this time.  I personally performed the services described in this documentation, which was scribed in my presence. The recorded information has been reviewed and is accurate.   Izola PriceFrances C. Marisue HumbleSanford, PA-C 05/18/13 1642

## 2013-05-18 NOTE — ED Notes (Signed)
Ortho tech called for application of splint to R wrist.

## 2013-05-18 NOTE — ED Notes (Signed)
Per pt, walking dog this am and fell catching self with right wrist.  No head trauma or LOC.  Pt drove self to ED

## 2013-05-18 NOTE — Discharge Instructions (Signed)
Cast or Splint Care °Casts and splints support injured limbs and keep bones from moving while they heal. It is important to care for your cast or splint at home.   °HOME CARE INSTRUCTIONS °· Keep the cast or splint uncovered during the drying period. It can take 24 to 48 hours to dry if it is made of plaster. A fiberglass cast will dry in less than 1 hour. °· Do not rest the cast on anything harder than a pillow for the first 24 hours. °· Do not put weight on your injured limb or apply pressure to the cast until your health care provider gives you permission. °· Keep the cast or splint dry. Wet casts or splints can lose their shape and may not support the limb as well. A wet cast that has lost its shape can also create harmful pressure on your skin when it dries. Also, wet skin can become infected. °· Cover the cast or splint with a plastic bag when bathing or when out in the rain or snow. If the cast is on the trunk of the body, take sponge baths until the cast is removed. °· If your cast does become wet, dry it with a towel or a blow dryer on the cool setting only. °· Keep your cast or splint clean. Soiled casts may be wiped with a moistened cloth. °· Do not place any hard or soft foreign objects under your cast or splint, such as cotton, toilet paper, lotion, or powder. °· Do not try to scratch the skin under the cast with any object. The object could get stuck inside the cast. Also, scratching could lead to an infection. If itching is a problem, use a blow dryer on a cool setting to relieve discomfort. °· Do not trim or cut your cast or remove padding from inside of it. °· Exercise all joints next to the injury that are not immobilized by the cast or splint. For example, if you have a long leg cast, exercise the hip joint and toes. If you have an arm cast or splint, exercise the shoulder, elbow, thumb, and fingers. °· Elevate your injured arm or leg on 1 or 2 pillows for the first 1 to 3 days to decrease  swelling and pain. It is best if you can comfortably elevate your cast so it is higher than your heart. °SEEK MEDICAL CARE IF:  °· Your cast or splint cracks. °· Your cast or splint is too tight or too loose. °· You have unbearable itching inside the cast. °· Your cast becomes wet or develops a soft spot or area. °· You have a bad smell coming from inside your cast. °· You get an object stuck under your cast. °· Your skin around the cast becomes red or raw. °· You have new pain or worsening pain after the cast has been applied. °SEEK IMMEDIATE MEDICAL CARE IF:  °· You have fluid leaking through the cast. °· You are unable to move your fingers or toes. °· You have discolored (blue or white), cool, painful, or very swollen fingers or toes beyond the cast. °· You have tingling or numbness around the injured area. °· You have severe pain or pressure under the cast. °· You have any difficulty with your breathing or have shortness of breath. °· You have chest pain. °Document Released: 04/18/2000 Document Revised: 02/09/2013 Document Reviewed: 10/28/2012 °[employer]® Patient Information ©2014 [employer]. ° °Wrist Fracture °A wrist fracture is a break in one of the bones of   the wrist. Your wrist is made up of several small bones at the palm of your hand (carpal bones) and the two bones that make up your forearm (radius and ulna). The bones come together to form multiple large and small joints. The shape and design of these joints allow your wrist to bend and straighten, move side-to-side, and rotate, as in twisting your palm up or down. °CAUSES  °A fracture may occur in any of the bones in your wrist when enough force is applied to the wrist, such as when falling down onto an outstretched hand. Severe injuries may occur from a more forceful injury. °SYMPTOMS °Symptoms of wrist fractures include tenderness, bruising, and swelling. Additionally, the wrist may hang in an odd position or may be misshaped. °DIAGNOSIS °To  diagnose a wrist fracture, your caregiver will physically examine your wrist. Your caregiver may also request an X-ray exam of your wrist. °TREATMENT °Treatment depends on many factors, including the nature and location of the fracture, your age, and your activity level. Treatment for wrist fracture can be nonsurgical or surgical. °For nonsurgical treatment, a plaster cast or splint may be applied to your wrist if the bone is in a good position (aligned). The cast will stay on for about 6 weeks. If the alignment of your bone is not good, it may be necessary to realign (reduce) it. After the bone is reduced, a splint usually is placed on your wrist to allow for a small amount of normal swelling. After about 1 week, the splint is removed and a cast is added. The cast is removed 2 or 3 weeks later, after the swelling goes down, causing the cast to loosen. Another cast is applied. This cast is removed after about another 2 or 3 weeks, for a total of 4 to 6 weeks of immobilization. °Sometimes the position of the bone is so far out of place that surgery is required to apply a device to hold it together as it heals. If the bone cannot be reduced without cutting the skin around the bone (closed reduction), a cut (incision) is made to allow direct access to the bone to reduce it (open reduction). Depending on the fracture, there are a number of options for holding the bone in place while it heals, including a cast, metal pins, a plate and screws, and a device called an external fixator. With an external fixator, most of the hardware remains outside of the body. °HOME CARE INSTRUCTIONS °· To lessen swelling, keep your injured wrist elevated and move your fingers as much as possible. °· Apply ice to your wrist for the first 1 to 2 days after you have been treated or as directed by your caregiver. Applying ice helps to reduce inflammation and pain. °· Put ice in a plastic bag. °· Place a towel between your skin and the  bag. °· Leave the ice on for 15 to 20 minutes at a time, every 2 hours while you are awake. °· Do not put pressure on any part of your cast or splint. It may break. °· Use a plastic bag to protect your cast or splint from water while bathing or showering. Do not lower your cast or splint into water. °· Only take over-the-counter or prescription medicines for pain as directed by your caregiver. °SEEK IMMEDIATE MEDICAL CARE IF:  °· Your cast or splint gets damaged or breaks. °· You have continued severe pain or more swelling than you did before the cast was put on. °·   Your skin or fingernails below the injury turn blue or gray or feel cold or numb. °· You develop decreased feeling in your fingers. °MAKE SURE YOU: °· Understand these instructions. °· Will watch your condition. °· Will get help right away if you are not doing well or get worse. °Document Released: 01/29/2005 Document Revised: 07/14/2011 Document Reviewed: 05/09/2011 °ExitCare® Patient Information ©2014 [employer]. ° °

## 2013-05-19 ENCOUNTER — Other Ambulatory Visit: Payer: Self-pay | Admitting: Family Medicine

## 2013-05-19 ENCOUNTER — Emergency Department (HOSPITAL_COMMUNITY)
Admission: EM | Admit: 2013-05-19 | Discharge: 2013-05-19 | Disposition: A | Discharge status: Home / Self Care | Payer: BC Managed Care – PPO | Attending: Emergency Medicine | Admitting: Emergency Medicine

## 2013-05-19 ENCOUNTER — Encounter (HOSPITAL_COMMUNITY): Payer: Self-pay | Admitting: Emergency Medicine

## 2013-05-19 DIAGNOSIS — Z8709 Personal history of other diseases of the respiratory system: Secondary | ICD-10-CM | POA: Insufficient documentation

## 2013-05-19 DIAGNOSIS — F329 Major depressive disorder, single episode, unspecified: Secondary | ICD-10-CM | POA: Insufficient documentation

## 2013-05-19 DIAGNOSIS — F411 Generalized anxiety disorder: Secondary | ICD-10-CM | POA: Insufficient documentation

## 2013-05-19 DIAGNOSIS — Z4789 Encounter for other orthopedic aftercare: Secondary | ICD-10-CM | POA: Insufficient documentation

## 2013-05-19 DIAGNOSIS — Z792 Long term (current) use of antibiotics: Secondary | ICD-10-CM | POA: Insufficient documentation

## 2013-05-19 DIAGNOSIS — M25539 Pain in unspecified wrist: Secondary | ICD-10-CM | POA: Insufficient documentation

## 2013-05-19 DIAGNOSIS — I10 Essential (primary) hypertension: Secondary | ICD-10-CM | POA: Insufficient documentation

## 2013-05-19 DIAGNOSIS — M7989 Other specified soft tissue disorders: Secondary | ICD-10-CM | POA: Insufficient documentation

## 2013-05-19 DIAGNOSIS — S62101A Fracture of unspecified carpal bone, right wrist, initial encounter for closed fracture: Secondary | ICD-10-CM

## 2013-05-19 DIAGNOSIS — E039 Hypothyroidism, unspecified: Secondary | ICD-10-CM | POA: Insufficient documentation

## 2013-05-19 DIAGNOSIS — Z79899 Other long term (current) drug therapy: Secondary | ICD-10-CM | POA: Insufficient documentation

## 2013-05-19 DIAGNOSIS — F3289 Other specified depressive episodes: Secondary | ICD-10-CM | POA: Insufficient documentation

## 2013-05-19 DIAGNOSIS — F17200 Nicotine dependence, unspecified, uncomplicated: Secondary | ICD-10-CM | POA: Insufficient documentation

## 2013-05-19 NOTE — ED Notes (Signed)
Pt reports that her splint is too tight around her hand and fingers. Pt was seen yesterday at this facility for a wrist injury of the right arm. Pt is neurovascularly intact. Pt has capillary refill less than three seconds and pt reports sensation upon being touched. There is mild swelling to the fingers of the extremity. Pt is mildly tachycardiac in triage at 112, however pt reports a long history of tachycardia and discharge pulse yesterday was 111. Other vitals are WDL, Pt is A/O x4, and in NAD.

## 2013-05-19 NOTE — Telephone Encounter (Signed)
Last visit 10-15-12 Last refill 11/16/12 #90 1 refill

## 2013-05-19 NOTE — Telephone Encounter (Signed)
Refill #90 with one additional refill 

## 2013-05-19 NOTE — Discharge Instructions (Signed)
Please call your doctor for a followup appointment within 24-48 hours. When you talk to your doctor please let them know that you were seen in the emergency department and have them acquire all of your records so that they can discuss the findings with you and formulate a treatment plan to fully care for your new and ongoing problems. Please call and set up appointment with hand specialist - Dr. Anastasio Champion, hand specialist - to be re-evaluated Please rest and stay hydrated Please avoid getting the splint wet Please keep arm elevated above heart level-please use sling while at work Please continue to apply ice Please monitor symptoms closely and if symptoms are to worsen or change (fever greater than 101, chills, neck pain, fall, weakness, numbness, tingling, changes to skin colored to the fingers) please report back to the ED  Wrist Fracture A wrist fracture is a break in one of the bones of the wrist. Your wrist is made up of several small bones at the palm of your hand (carpal bones) and the two bones that make up your forearm (radius and ulna). The bones come together to form multiple large and small joints. The shape and design of these joints allow your wrist to bend and straighten, move side-to-side, and rotate, as in twisting your palm up or down. CAUSES  A fracture may occur in any of the bones in your wrist when enough force is applied to the wrist, such as when falling down onto an outstretched hand. Severe injuries may occur from a more forceful injury. SYMPTOMS Symptoms of wrist fractures include tenderness, bruising, and swelling. Additionally, the wrist may hang in an odd position or may be misshaped. DIAGNOSIS To diagnose a wrist fracture, your caregiver will physically examine your wrist. Your caregiver may also request an X-ray exam of your wrist. TREATMENT Treatment depends on many factors, including the nature and location of the fracture, your age, and your activity level. Treatment  for wrist fracture can be nonsurgical or surgical. For nonsurgical treatment, a plaster cast or splint may be applied to your wrist if the bone is in a good position (aligned). The cast will stay on for about 6 weeks. If the alignment of your bone is not good, it may be necessary to realign (reduce) it. After the bone is reduced, a splint usually is placed on your wrist to allow for a small amount of normal swelling. After about 1 week, the splint is removed and a cast is added. The cast is removed 2 or 3 weeks later, after the swelling goes down, causing the cast to loosen. Another cast is applied. This cast is removed after about another 2 or 3 weeks, for a total of 4 to 6 weeks of immobilization. Sometimes the position of the bone is so far out of place that surgery is required to apply a device to hold it together as it heals. If the bone cannot be reduced without cutting the skin around the bone (closed reduction), a cut (incision) is made to allow direct access to the bone to reduce it (open reduction). Depending on the fracture, there are a number of options for holding the bone in place while it heals, including a cast, metal pins, a plate and screws, and a device called an external fixator. With an external fixator, most of the hardware remains outside of the body. HOME CARE INSTRUCTIONS  To lessen swelling, keep your injured wrist elevated and move your fingers as much as possible.  Apply ice  to your wrist for the first 1 to 2 days after you have been treated or as directed by your caregiver. Applying ice helps to reduce inflammation and pain.  Put ice in a plastic bag.  Place a towel between your skin and the bag.  Leave the ice on for 15 to 20 minutes at a time, every 2 hours while you are awake.  Do not put pressure on any part of your cast or splint. It may break.  Use a plastic bag to protect your cast or splint from water while bathing or showering. Do not lower your cast or splint  into water.  Only take over-the-counter or prescription medicines for pain as directed by your caregiver. SEEK IMMEDIATE MEDICAL CARE IF:   Your cast or splint gets damaged or breaks.  You have continued severe pain or more swelling than you did before the cast was put on.  Your skin or fingernails below the injury turn blue or gray or feel cold or numb.  You develop decreased feeling in your fingers. MAKE SURE YOU:  Understand these instructions.  Will watch your condition.  Will get help right away if you are not doing well or get worse. Document Released: 01/29/2005 Document Revised: 07/14/2011 Document Reviewed: 05/09/2011 [facility] Patient Information 2014 [place], [place].   Emergency Department Resource Guide 1) Find a Doctor and Pay Out of Pocket Although you won't have to find out who is covered by your insurance plan, it is a good idea to ask around and get recommendations. You will then need to call the office and see if the doctor you have chosen will accept you as a new patient and what types of options they offer for patients who are self-pay. Some doctors offer discounts or will set up payment plans for their patients who do not have insurance, but you will need to ask so you aren't surprised when you get to your appointment.  2) Contact Your Local Health Department Not all health departments have doctors that can see patients for sick visits, but many do, so it is worth a call to see if yours does. If you don't know where your local health department is, you can check in your phone book. The CDC also has a tool to help you locate your state's health department, and many state websites also have listings of all of their local health departments.  3) Find a Walk-in Clinic If your illness is not likely to be very severe or complicated, you may want to try a walk in clinic. These are popping up all over the country in pharmacies, drugstores, and shopping centers. They're  usually staffed by nurse practitioners or physician assistants that have been trained to treat common illnesses and complaints. They're usually fairly quick and inexpensive. However, if you have serious medical issues or chronic medical problems, these are probably not your best option.  No Primary Care Doctor: - Call Health Connect at  [phone] - they can help you locate a primary care doctor that  accepts your insurance, provides certain services, etc. - Physician Referral Service- [phone]  Chronic Pain Problems: Organization         Address  Phone   Notes  [facility]  [phone] Patients need to be referred by their primary care doctor.   Medication Assistance: Organization         Address  Phone   Notes  [facility] [address]  [place], [place] [address] [phone] --Must be a resident of [facility] -- Must have NO insurance coverage whatsoever (no Medicaid/ Medicare, etc.) -- The pt. MUST have a primary care doctor that directs their care regularly and follows them in the community   MedAssist  [phone]   [employer]  [phone]    Agencies that provide inexpensive medical care: Organization         Address  Phone   Notes  [place]  [phone]   [facility]    [phone]   [facility] [place], [place] [address] [phone]   [place] [address] [place] [phone]   Planned Parenthood    [phone]   [facility]    [phone]   [place] [address], [place] Phone:  [phone], Fax:  [phone] Hours of Operation:  9 am - 6 pm, M-F.  Also accepts Medicaid/Medicare and self-pay.  [facility] for [place] [address], [address], [place] Phone: [phone], Fax: [phone]. Hours  of Operation:  8:30 am - 5:30 pm, M-F.  Also accepts Medicaid and self-pay.  [facility] [address] [place] Phone: [phone]   [place], [place], [place] [phone], Ext. [phone] Mondays & Thursdays: 7-9 AM.  First 15 patients are seen on a first come, first serve basis.    Phil Campbell Providers:  Organization         Address  Phone   Notes  [facility] [address], [place] [phone] Also accepts self-pay patients.  [facility] [address], [place]  [phone]   [place], [address], [place] [phone]   [facility] [address], [place] [phone]   Lucianne Lei [address], [place]   [phone] Only accepts [place] Access [place] patients after they have their name applied to their card.   Self-Pay (no insurance) in [facility]:  Organization         Address  Phone   Notes  Sickle Cell Patients, [facility] [place] [phone]   [facility] [place] [phone]   [facility] [place]  [place], [place], [place] [phone]   [facility]/Dr. Osei-Bonsu  [address], [place] or [address], [place] [phone] Phone number for both [place] and [place] locations is the same.  [facility] [address], [place] [phone]   [facility] [address] [place] or [address] [phone]   [facility] [address], [place] [phone], phone; [phone], fax Sees patients 1st and 3rd Saturday of every month.  Must not qualify for public or private insurance (i.e. Medicaid,  Medicare, [place] Health Choice, Veterans' Benefits)  Household income should be no more than 200% of the poverty level The clinic cannot treat you if you are pregnant or think you are pregnant  Sexually transmitted diseases are not treated at the clinic.    Dental Care:  Organization         Address  Phone  Notes  [facility] Department of [facility] [place] [phone] Accepts children up to age 59 who are enrolled in [place] or [place]; pregnant women with a Medicaid card; and children who have applied for Medicaid or [place] Health Choice, but were declined, whose parents can pay a reduced fee at time of service.  [facility] Department of [facility]  [address], [place] [phone] Accepts children up to age 27 who are enrolled in [place] or [place]; pregnant women with a Medicaid card; and children who have applied for Medicaid or [place] Health Choice, but were declined, whose parents can pay a reduced fee at time of service.  [place] Adult Dental Access PROGRAM  [place] [phone] Patients are seen by appointment only. Walk-ins are not accepted. [place] will see patients 70 years of age and older. Monday - Tuesday (8am-5pm) Most Wednesdays (8:30-5pm) $30 per visit, cash only  [facility] Adult Dental Access PROGRAM  [address], [facility] [phone] Patients are seen by appointment only. Walk-ins are not accepted. [place] will see patients 60 years of age and older. One Wednesday Evening (Monthly: Volunteer Based).  $30 per visit, cash only  [place]  [phone] for adults; Children under age 53, call Graduate Pediatric Dentistry at [phone]. Children aged 72-14, please call [phone] to request a pediatric application.  Dental services are provided in all areas of dental care including fillings, crowns  and bridges, complete and partial dentures, implants, gum treatment, root canals, and extractions. Preventive care is also provided. Treatment is provided to both adults and children. Patients are selected via a lottery and there is often a waiting list.   [facility] [address] [place]  [phone] [email or web address]   [facility] [address] [place], [place] [phone], Ext. [phone] Second and Fourth Thursday of each month, opens at 6:30 AM; Clinic ends at 9 AM.  Patients are seen on a first-come first-served basis, and a limited number are seen during each clinic.   [facility]  [address] [place]ard Danker [place], [place] [phone]   Eligibility Requirements You must have lived in [place], [place], or [place] counties for at least the last three months.   You cannot be eligible for state or federal sponsored [employer], including [employer], [place], or [employer].   You generally cannot be eligible for healthcare insurance through your employer.    How to apply: Eligibility screenings are held every Tuesday and Wednesday afternoon from 1:00 pm until 4:00 pm. You do not need an appointment for the interview!  [facility] [address], [place], [place]   [place]  [place] Department  [place]  [phone]    Behavioral Health Resources in the Community: Intensive Outpatient Programs Organization         Address  Phone  Notes  [place]. [address] [place], [place] [phone]   [facility] [address], [place], [place]   ADS: Alcohol & Drug S[facility]cs [address], [place], [place]   [place] [address]  [place], [place]  [phone] or [phone]   Substance Abuse  Resources Organization         Address  Phone  Notes  Alcohol and Drug Services  [place]  [phone]   [facility] [place]  ([phone]   Chinita Pester  [phone]   Residential & Outpatient Substance Abuse Program  [phone]   Psychological Services Organization         Address  Phone  Notes  [facility] [place]  [place]  [phone]   [place] [address], [place] or [phone]    Mobile Crisis Teams Organization         Address  Phone  Notes  [facility], Mobile Crisis Care Unit  [phone]   [facility]otherapeutic [facility]  [address] [place], [place]   [place] Levels [address] [place] [phone]    Self-Help/Support Groups Organization         Address  Phone             Notes  [place]. of [place] - variety of support groups  [place] Call for more information  Narcotics Anonymous (NA), [facility] [address], [employer] [place]  2 meetings at this location   Special educational needs teacher         Address  Phone  Notes  [facility]idential [facility] [place],    [place]  1-7321000975   [facility]  [address] [place] [address], [place], [place]   [place], [place] [phone] Admissions: 8am-3pm M-F  Incentives Substance [place] [address]    [place], [place] [phone]   [facility] [address] [place], [place], [place]   [facility] [address]  [place], [place]   [facility] - Intensive Outpatient [address] Kristeen [address], [place], [place]   [facility] [place].,  [place], [place] 1-4841196882 or [phone]   [facility] ([facility]) [address], [place], [place] Accepts Medicaid  [facility] [place] [address]  [place] [phone])[phone] Substance Abuse/Addiction Treatment   [facility] Organization         Address  Phone  Notes  [facility]  [phone]   Domenic Schwab, PhD [address] [place], [place]   [phone] or [phone]   [place], [place] [phone]   [facility] [address], [place], [place] [phone] Insurance/Medicaid/sponsorship through [facility] and Families [address] [place]                                    [place], [place] [phone] [place] [address], [place] [phone]    Dr. Adele Schilder  [phone]   [facility] Dept. 1) [address], [place] 2) [place] 3)  [place] [address], [place] [phone]  [phone]   [place] [phone] or [phone] (After Hours)

## 2013-05-19 NOTE — ED Notes (Signed)
Ortho tec called to evaluate status of cast on r/hand

## 2013-05-19 NOTE — ED Provider Notes (Signed)
CSN: [id number]     Arrival date & time 05/19/13  1523 History   This chart was scribed for non-physician practitioner Raymon Mutton, PA-C,  working with Toy Baker, MD, by Yevette Edwards, ED Scribe. This patient was seen in room WTR9/WTR9 and the patient's care was started at 5:06 PM. First MD Initiated Contact with Patient 05/19/13 1625     Chief Complaint  Patient presents with  . Wound Check   The history is provided by the patient. No language interpreter was used.   HPI Comments: Colleen Owen is a 58 y.o. female, with a h/o anxiety, who presents to the Emergency Department for a wound check of a splint that was applied to her right wrist yesterday following an injury. Imaging from yesterday's treatment indicates the pt has an acute triquetrim fracture and was placed in a splint. However, since the placement of the splint, she has experienced tightness to the hand and fingers of her right hand, swelling to the fingers of her right hand, a color change of blue in the morning to her fingers, and a sensation of "throbbing" to her fingers. She denies paresthesia/numbness to her fingers and weakness to her fingers. The pt states the injury to her wrist occurred yesterday when she slipped and fell on ice. She denies any other falls. She called a hand specialist yesterday, but she was unable to obtain an immediate appointment, and she also reports her insurance deductible is too great for an appointment. She is concerned because she lives independently and must work, but the current splint makes it difficult to fulfill her work duties. The pt is a current smoker.   The pt is right-handed.   Past Medical History  Diagnosis Date  . HYPOTHYROIDISM 09/08/2008  . ANXIETY 09/08/2008  . DEPRESSION 09/08/2008  . HYPERTENSION 09/08/2008  . Acute bronchitis 02/20/2009  . ELEVATED BLOOD PRESSURE 07/16/2009   Past Surgical History  Procedure Laterality Date  . Urethra surgery  2008   Family History   Problem Relation Age of Onset  . Cancer Mother     cancer  . Hypertension Mother   . Diabetes Father   . Stroke Father   . Alcohol abuse Maternal Grandfather    History  Substance Use Topics  . Smoking status: Current Every Day Smoker -- 1.50 packs/day for 45 years    Types: Cigarettes  . Smokeless tobacco: Never Used  . Alcohol Use: 3.5 oz/week    7 drink(s) per week   No OB history provided.  Review of Systems  Constitutional: Negative for fever.  Musculoskeletal: Positive for arthralgias.  Skin: Positive for color change.  Neurological: Negative for weakness and numbness.  All other systems reviewed and are negative.   Allergies  Codeine sulfate and Loratadine-pseudoephedrine er  Home Medications   Current Outpatient Rx  Name  Route  Sig  Dispense  Refill  . acetaminophen (TYLENOL) 325 MG tablet   Oral   Take 650 mg by mouth every 6 (six) hours as needed (pain).         Marland Kitchen amitriptyline (ELAVIL) 50 MG tablet   Oral   Take 75 mg by mouth at bedtime.          Marland Kitchen azithromycin (ZITHROMAX) 250 MG tablet   Oral   Take 250 mg by mouth daily.         . calcium carbonate (OS-CAL) 600 MG TABS   Oral   Take 600 mg by mouth daily.          Marland Kitchen  fish oil-omega-3 fatty acids 1000 MG capsule   Oral   Take 1 g by mouth daily.          Marland Kitchen guaiFENesin (MUCINEX) 600 MG 12 hr tablet   Oral   Take 600 mg by mouth 2 (two) times daily as needed (cough).         Marland Kitchen ibuprofen (ADVIL,MOTRIN) 200 MG tablet   Oral   Take 400 mg by mouth every 6 (six) hours as needed (pain).         Marland Kitchen levothyroxine (SYNTHROID, LEVOTHROID) 125 MCG tablet      TAKE ONE TABLET BY MOUTH DAILY   90 tablet   3   . Multiple Vitamins-Minerals (MULTIVITAMIN WITH MINERALS) tablet   Oral   Take 1 tablet by mouth daily.           Marland Kitchen ALPRAZolam (XANAX) 0.5 MG tablet      TAKE 1/2 TABLET BY MOUTH IN THE MORNING, 1/2 TABLET IN THE EVENING AT BEDTIME AS NEEDED.   90 tablet   1    Triage  Vitals: BP 139/104  Pulse 112  Temp(Src) 98.6 F (37 C) (Oral)  Resp 20  SpO2 95%  Physical Exam  Nursing note and vitals reviewed. Constitutional: She is oriented to person, place, and time. She appears well-developed and well-nourished. No distress.  HENT:  Head: Normocephalic and atraumatic.  Eyes: EOM are normal.  Neck: Neck supple. No tracheal deviation present.  Cardiovascular: Normal rate, regular rhythm and normal heart sounds.   Pulses:      Radial pulses are 2+ on the right side, and 2+ on the left side.  Cap refill less than 3 seconds  Pulmonary/Chest: Effort normal and breath sounds normal. No respiratory distress. She has no wheezes. She has no rales.  Musculoskeletal: Normal range of motion. She exhibits tenderness.  Negative swelling noted to the right wrist. Discomfort upon palpation to the right wrist-circumferential in nature. Flexion and extension identified. Full range of motion noted to the digits of the right hand.  Neurological: She is alert and oriented to person, place, and time. She exhibits normal muscle tone. Coordination normal.  Sensation intact Strength intact to MCP, PIP, DIP joints of the right hand  Skin: Skin is warm and dry.  Psychiatric: She has a normal mood and affect. Her behavior is normal.    ED Course  Procedures (including critical care time)  DIAGNOSTIC STUDIES: Oxygen Saturation is 95% on room air, adequate by my interpretation.    COORDINATION OF CARE:  7:07 PM- Discussed treatment plan with patient, which includes modifying the splint, and the patient agreed to the plan. Reminded the pt to use ice and elevation to alleviate her symptoms. Advised pt to follow-up with a hand specialist.   Dg Wrist Complete Right  05/18/2013   CLINICAL DATA:  Fall.  Pain  EXAM: RIGHT WRIST - COMPLETE 3+ VIEW  COMPARISON:  05/18/2013  FINDINGS: The bones appear osteopenic. There is a osseous structure along the dorsum of the wrist which is concerning  for triquetrum fracture. Overlying soft tissue swelling is noted. No dislocations.  IMPRESSION: Suspect triquetrum fracture. Correlation with exact sided tenderness is advised.   Electronically Signed   By: Signa Kell M.D.   On: 05/18/2013 15:18   Dg Hand Complete Right  05/18/2013   CLINICAL DATA:  58 year old female status post fall with pain. Initial encounter.  EXAM: RIGHT HAND - COMPLETE 3+ VIEW  COMPARISON:  None.  FINDINGS: Osteopenia.  Distal radius and ulna appear intact.  There is some dorsal soft tissue swelling at the wrist, and there are small irregular ossific fragments dorsal to the carpal bones. No other carpal bone fracture or dislocation.  Metacarpals appear intact ; mild osseous irregularity near the base of the fifth metacarpal is felt to be degenerative. Fourth finger ring artifact. Phalanges appear intact.  IMPRESSION: 1. Appearance compatible with acute triquetrum fracture. 2. No other acute fracture identified about the right hand.   Electronically Signed   By: Augusto GambleLee  Hall M.D.   On: 05/18/2013 15:15   Labs Review Labs Reviewed - No data to display Imaging Review No results found.  EKG Interpretation   None       MDM   1. Right wrist fracture     Filed Vitals:   05/19/13 1539  BP: 139/104  Pulse: 112  Temp: 98.6 F (37 C)  TempSrc: Oral  Resp: 20  SpO2: 95%   Patient presenting to emergency department with request for changing her cast/splint that was placed yesterday regarding right wrist fracture of the triquetrum. Patient reported that her fingers were swollen and red this morning. Reports throbbing sensation to the fingers. Patient states she's not been taking any over-the-counter medication. Reported that she has not been elevating her wrist. Alert and oriented. GCS 15. Heart rate and rhythm normal. Lungs good auscultation. Splint removed-negative swelling noted to the right breast. Discomfort upon palpation to the right wrist circumferential-more  pronounced upon the ulnar aspect. Full range of motion noted-full flexion extension identified. Full range of motion noted to the digits of the right hand. Strength intact to digits of the right hand-strength intact to the MCP, PIP, DIP joints of the right hand. Cap refill less than 3 seconds. Patient neurovascular intact. Splint replaced for comfort. Patient stable, afebrile. Discharged patient. Discussed with patient to keep arm elevated. Discussed with patient to continue to ice. Discussed with patient to rest. Referred patient to hand surgeon. Discussed with patient to closely monitor symptoms and if symptoms are to worsen or change to report back to the ED - strict return instructions given.  Patient agreed to plan of care, understood, all questions answered.     Raymon MuttonMarissa Breana Litts, PA-C 05/20/13 1909

## 2013-05-21 NOTE — ED Provider Notes (Signed)
Medical screening examination/treatment/procedure(s) were performed by non-physician practitioner and as supervising physician I was immediately available for consultation/collaboration.  EKG Interpretation   None         Ahmere Hemenway, MD 05/21/13 0702 

## 2013-05-23 NOTE — ED Provider Notes (Signed)
Medical screening examination/treatment/procedure(s) were performed by non-physician practitioner and as supervising physician I was immediately available for consultation/collaboration.   Faige Seely T Ajanae Virag, MD 05/23/13 0720 

## 2013-06-10 ENCOUNTER — Telehealth: Payer: Self-pay | Admitting: Family Medicine

## 2013-06-10 MED ORDER — AMITRIPTYLINE HCL 50 MG PO TABS: 75.0000 mg | ORAL_TABLET | Freq: Every day | ORAL | Status: DC

## 2013-06-10 NOTE — Telephone Encounter (Signed)
Pt would like new rx amitriptyline 75 mg #30 call into [facility]

## 2013-06-10 NOTE — Telephone Encounter (Signed)
Rx sent to pharmacy   

## 2013-07-22 ENCOUNTER — Ambulatory Visit (INDEPENDENT_AMBULATORY_CARE_PROVIDER_SITE_OTHER): Payer: BC Managed Care – PPO | Admitting: Family Medicine

## 2013-07-22 ENCOUNTER — Telehealth: Payer: Self-pay | Admitting: Family Medicine

## 2013-07-22 ENCOUNTER — Encounter: Payer: Self-pay | Admitting: Family Medicine

## 2013-07-22 VITALS — BP 136/90 | Temp 98.1°F | Wt 166.0 lb

## 2013-07-22 DIAGNOSIS — F411 Generalized anxiety disorder: Secondary | ICD-10-CM

## 2013-07-22 DIAGNOSIS — I10 Essential (primary) hypertension: Secondary | ICD-10-CM

## 2013-07-22 MED ORDER — AMITRIPTYLINE HCL 75 MG PO TABS: 75.0000 mg | ORAL_TABLET | Freq: Every day | ORAL | Status: DC

## 2013-07-22 NOTE — Progress Notes (Signed)
Pre visit review using our clinic review tool, if applicable. No additional management support is needed unless otherwise documented below in the visit note. 

## 2013-07-22 NOTE — Telephone Encounter (Signed)
Patient Information:  Caller Name: Colleen Owen  Phone: [phone]  Patient: Colleen Owen, Colleen Owen  Gender: Female  DOB: 02/19/1956  Age: 58 Years  PCP: Evelena PeatBurchette, Bruce (Family Practice)  Office Follow Up:  Does the office need to follow up with this patient?: No  Instructions For The Office: N/A   Symptoms  Reason For Call & Symptoms: Colleen Owen calling regarding increased BP since this am, 07/22/13 and feeling anxious. BP 192/96 at 11. 153-108 at 1440. Not on medication. No cardiac hx.  Reviewed Health History In EMR: Yes  Reviewed Medications In EMR: Yes  Reviewed Allergies In EMR: Yes  Reviewed Surgeries / Procedures: Yes  Date of Onset of Symptoms: 07/18/2013  Guideline(s) Used:  High Blood Pressure  Disposition Per Guideline:   See Within 2 Weeks in Office  Reason For Disposition Reached:   BP > 160/100  Advice Given:  N/A  Patient Will Follow Care Advice:  YES  Appointment Scheduled:  07/22/2013 15:00:00 Pt states she can be there by 15:00 Appointment Scheduled Provider:  Selena Owen (TEXT 1st, after 20 mins can call), Colleen Owen(Family Practice)

## 2013-07-22 NOTE — Patient Instructions (Signed)
-  check blood pressure 3 times per week after sitting with feet flat on the floor for 5 minutes, no talking while checking  -bring log and cuff with you to an appointment in 1 month with your doctor  -follow up with your doctor in 1 month about your blood pressure and anxiety medications or sooner as needed

## 2013-07-22 NOTE — Progress Notes (Signed)
Chief Complaint  Patient presents with  . 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  Past Medical History  Diagnosis Date  . HYPOTHYROIDISM 09/08/2008  . ANXIETY 09/08/2008  . DEPRESSION 09/08/2008  . HYPERTENSION 09/08/2008  . Acute bronchitis 02/20/2009  . ELEVATED BLOOD PRESSURE 07/16/2009    Past Surgical History  Procedure Laterality Date  . Urethra surgery  2008    Family History  Problem Relation Age of Onset  . Cancer Mother     cancer  . Hypertension Mother   . Diabetes Father   . Stroke Father   . Alcohol abuse Maternal Grandfather     History   Social History  . Marital Status: Legally Separated    Spouse Name: N/A    Number of Children: N/A  . Years of Education: N/A   Social History Main Topics  . Smoking status: Current Every Day Smoker -- 1.50 packs/day for 45 years    Types: Cigarettes  . Smokeless tobacco: Never Used  . Alcohol Use: 3.5 oz/week    7 drink(s) per week  . Drug Use: No  . Sexual Activity: None   Other Topics Concern  . None   Social History Narrative  . None    Current outpatient prescriptions:acetaminophen (TYLENOL) 325 MG tablet, Take 650 mg by mouth every 6 (six) hours as needed (pain)., Disp: , Rfl: ;  ALPRAZolam (XANAX) 0.5 MG tablet, TAKE 1/2 TABLET BY MOUTH IN THE MORNING, 1/2 TABLET IN THE EVENING AT BEDTIME AS NEEDED., Disp: 90 tablet, Rfl: 1;  amitriptyline (ELAVIL) 75 MG tablet, Take 1 tablet (75 mg total) by mouth at  bedtime., Disp: 30 tablet, Rfl: 0 azithromycin (ZITHROMAX) 250 MG tablet, Take 250 mg by mouth daily., Disp: , Rfl: ;  calcium carbonate (OS-CAL) 600 MG TABS, Take 600 mg by mouth daily. , Disp: , Rfl: ;  fish oil-omega-3 fatty acids 1000 MG capsule, Take 1 g by mouth daily. , Disp: , Rfl: ;  guaiFENesin (MUCINEX) 600 MG 12 hr tablet, Take 600 mg by mouth 2 (two) times daily as needed (cough)., Disp: , Rfl:  ibuprofen (ADVIL,MOTRIN) 200 MG tablet, Take 400 mg by mouth every 6 (six) hours as needed (pain)., Disp: , Rfl: ;  levothyroxine (SYNTHROID, LEVOTHROID) 125 MCG tablet, TAKE ONE TABLET BY MOUTH DAILY, Disp: 90 tablet, Rfl: 3;  Multiple Vitamins-Minerals (MULTIVITAMIN WITH MINERALS) tablet, Take 1 tablet by mouth daily.  , Disp: , Rfl:   EXAM:  Filed Vitals:   07/22/13 1517  BP: 136/90  Temp: 98.1 F (36.7 C)    Body mass index is 28.48 kg/(m^2).  GENERAL: vitals reviewed and listed above, alert, oriented, appears well hydrated and in no acute distress  HEENT: atraumatic, conjunttiva clear, no obvious abnormalities on inspection of external nose and ears  NECK: no obvious masses on inspection  LUNGS: clear to auscultation bilaterally, no wheezes, rales or rhonchi, good air movement  CV: HRRR, no  peripheral edema  MS: moves all extremities without noticeable abnormality  PSYCH: pleasant and cooperative, no obvious depression or anxiety  ASSESSMENT AND PLAN:  Discussed the following assessment and plan:  HYPERTENSION  ANXIETY - Plan: amitriptyline (ELAVIL) 75 MG tablet  -discuss options for her mild [facility]N, [facility] - she would prefer not to start medication if possible -advised checking 3x per week and keeping log and follow up in 1 month to recheck -refilled the amitriptyline for one month and advised as has not seen PCP in a long time should get further refills at follow up -Patient advised to return or notify a doctor immediately if symptoms worsen or persist or new concerns  arise.  Patient Instructions  -check blood pressure 3 times per week after sitting with feet flat on the floor for 5 minutes, no talking while checking  -bring log and cuff with you to an appointment in 1 month with your doctor  -follow up with your doctor in 1 month about your blood pressure and anxiety medications or sooner as needed     Lenon Kuennen R.

## 2013-07-22 NOTE — Telephone Encounter (Signed)
Patient calling about "Blood pressure elevated; talked to the doctor about meds."  On callback, RN unable to reach patient at number given; message left on identified voicemail to contact office for assistance.  krs/can

## 2013-07-25 ENCOUNTER — Telehealth: Payer: Self-pay | Admitting: Family Medicine

## 2013-07-25 NOTE — Telephone Encounter (Signed)
Relevant patient education assigned to patient using Emmi. ° °

## 2013-08-26 ENCOUNTER — Encounter: Payer: Self-pay | Admitting: Family Medicine

## 2013-08-26 ENCOUNTER — Ambulatory Visit (INDEPENDENT_AMBULATORY_CARE_PROVIDER_SITE_OTHER): Payer: BC Managed Care – PPO | Admitting: Family Medicine

## 2013-08-26 VITALS — BP 130/90 | HR 108 | Temp 98.0°F | Wt 166.0 lb

## 2013-08-26 DIAGNOSIS — R030 Elevated blood-pressure reading, without diagnosis of hypertension: Secondary | ICD-10-CM

## 2013-08-26 DIAGNOSIS — E039 Hypothyroidism, unspecified: Secondary | ICD-10-CM

## 2013-08-26 NOTE — Progress Notes (Signed)
   Subjective:    Patient ID: Colleen Owen, female    DOB: 04/15/1956, 58 y.o.   MRN: [id number]  HPI Patient here for followup elevated blood pressure. She has had recent reading of 153/108 but had significant stress at work that day. She seen her about month ago with borderline blood pressure and recommended followup now. She has never been treated for hypertension. She's had borderline readings in the past. She is smoking but is trying to scale back. No regular nonsteroidal use. She drinks about 6 ounces of wine per day.  Brings a log of home blood pressure readings and these are around 114-140 systolic and 80s to 90s diastolic. No headaches. No chest pains.  Hypothyroidism. She's not had labs in over one year. Weight stable. Appetite stable.  Past Medical History  Diagnosis Date  . HYPOTHYROIDISM 09/08/2008  . ANXIETY 09/08/2008  . DEPRESSION 09/08/2008  . HYPERTENSION 09/08/2008  . Acute bronchitis 02/20/2009  . ELEVATED BLOOD PRESSURE 07/16/2009   Past Surgical History  Procedure Laterality Date  . Urethra surgery  2008    reports that she has been smoking Cigarettes.  She has a 67.5 pack-year smoking history. She has never used smokeless tobacco. She reports that she drinks about 3.5 ounces of alcohol per week. She reports that she does not use illicit drugs. family history includes Alcohol abuse in her maternal grandfather; Cancer in her mother; Diabetes in her father; Hypertension in her mother; Stroke in her father. Allergies  Allergen Reactions  . Codeine Sulfate Nausea And Vomiting  . Loratadine-Pseudoephedrine Er     "Wired, cannot sleep"      Review of Systems  Constitutional: Negative for fatigue.  Eyes: Negative for visual disturbance.  Respiratory: Negative for cough, chest tightness, shortness of breath and wheezing.   Cardiovascular: Negative for chest pain, palpitations and leg swelling.  Neurological: Negative for dizziness, seizures, syncope, weakness,  light-headedness and headaches.       Objective:   Physical Exam  Constitutional: She appears well-developed and well-nourished.  Neck: Neck supple. No thyromegaly present.  Cardiovascular: Normal rate and regular rhythm.   Pulmonary/Chest: Effort normal and breath sounds normal. No respiratory distress. She has no wheezes. She has no rales.  Musculoskeletal: She exhibits no edema.          Assessment & Plan:  #1 elevated blood pressure. Borderline elevation. Repeat reading by me today was 132/92. Continue observation. Be in touch if consistently greater than 140/90. Do not consume more than 6-8 ounces wine per day. #2 hypothyroidism. Recheck TSH.

## 2013-08-26 NOTE — Progress Notes (Signed)
Pre visit review using our clinic review tool, if applicable. No additional management support is needed unless otherwise documented below in the visit note. 

## 2013-08-26 NOTE — Patient Instructions (Signed)
Monitor blood pressure and be in touch if consistently > 140/90.   

## 2013-08-29 LAB — TSH: TSH: 0.47 u[IU]/mL (ref 0.35–5.50)

## 2013-10-03 ENCOUNTER — Telehealth: Payer: Self-pay | Admitting: Family Medicine

## 2013-10-03 DIAGNOSIS — F411 Generalized anxiety disorder: Secondary | ICD-10-CM

## 2013-10-03 NOTE — Telephone Encounter (Signed)
Last visit 08/26/13 Last refill 07/22/13 #30 0 refill

## 2013-10-03 NOTE — Telephone Encounter (Signed)
Refill with 3 additional refills   

## 2013-10-03 NOTE — Telephone Encounter (Signed)
Pt  Called to req  that rx  amitriptyline (ELAVIL) 75 MG tablet  be called to in [facility]

## 2013-10-04 MED ORDER — AMITRIPTYLINE HCL 75 MG PO TABS: 75.0000 mg | ORAL_TABLET | Freq: Every day | ORAL | Status: DC

## 2013-10-04 NOTE — Telephone Encounter (Signed)
Faxed Rx to pharmacy  

## 2013-11-14 ENCOUNTER — Other Ambulatory Visit: Payer: Self-pay | Admitting: Family Medicine

## 2013-11-14 NOTE — Telephone Encounter (Signed)
Refill for 6 months. 

## 2013-11-14 NOTE — Telephone Encounter (Signed)
Last visit 08/26/13 Last refill 05/19/13 #90 1 refill

## 2014-02-01 ENCOUNTER — Telehealth: Payer: Self-pay | Admitting: Family Medicine

## 2014-02-01 DIAGNOSIS — F411 Generalized anxiety disorder: Secondary | ICD-10-CM

## 2014-02-01 NOTE — Telephone Encounter (Signed)
[id number] - [place], [place] - [address] Is requesting re-fill on amitriptyline (ELAVIL) 75 MG tablet

## 2014-02-02 MED ORDER — AMITRIPTYLINE HCL 75 MG PO TABS: 75.0000 mg | ORAL_TABLET | Freq: Every day | ORAL | Status: DC

## 2014-02-02 NOTE — Telephone Encounter (Signed)
Last visit 08/26/13 Last refill 10/04/13 #30 3 refill

## 2014-02-02 NOTE — Telephone Encounter (Signed)
Refill for 6 months. 

## 2014-02-02 NOTE — Telephone Encounter (Signed)
Faxed Rx to pharmacy  

## 2014-05-08 ENCOUNTER — Other Ambulatory Visit: Payer: Self-pay | Admitting: Family Medicine

## 2014-05-08 NOTE — Telephone Encounter (Signed)
Refill once 

## 2014-05-08 NOTE — Telephone Encounter (Signed)
Xanax  Last visit 08/26/13 Last refill 11/15/13 #90 1 refill

## 2014-06-30 IMAGING — CT CT ANGIO CHEST
2 of 6 series · 18 of 46 positions shown · IV contrast (APPLIED)
Comparison: 05/30/2012 chest radiograph and 02/13/2006 chest CT

CLINICAL DATA: 56-year-old female with chest pain.

CT ANGIOGRAPHY CHEST
TECHNIQUE: Multidetector CT imaging of the chest using the
standard protocol during bolus administration of intravenous
contrast. Multiplanar reconstructed images including MIPs were
obtained and reviewed to evaluate the vascular anatomy.
Contrast: 100mL OMNIPAQUE IOHEXOL 350 MG/ML SOLN

[Series 6: pulm embolism 1.0 b25f thin · axial · 0.70mm/px · z∈[-280,-8]mm · 15 of 301 slices shown]
[im 14/301  lung]
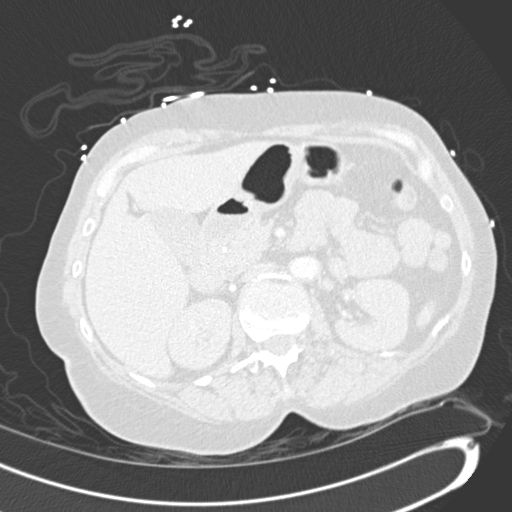
[im 40/301  soft-tissue]
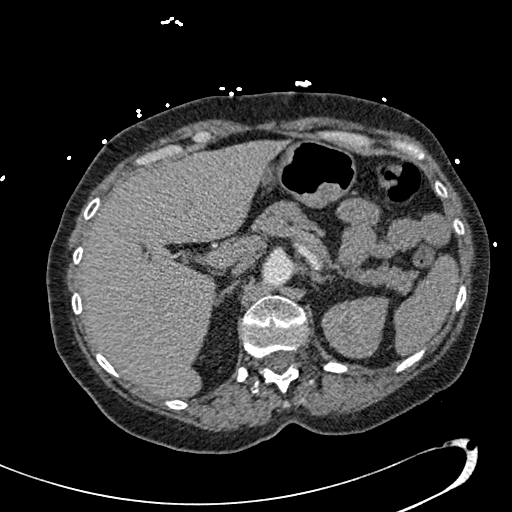
[im 53/301  lung]
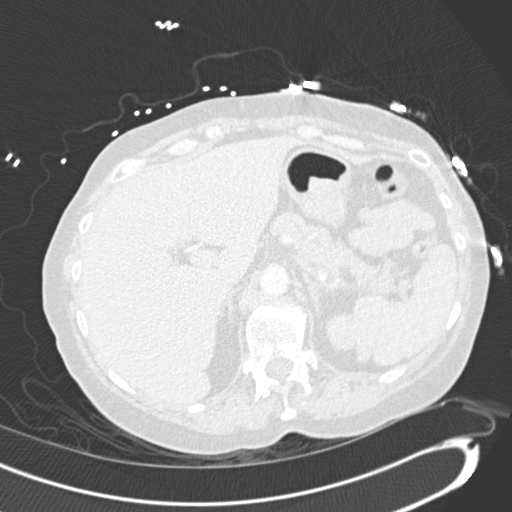
[im 79/301  soft-tissue]
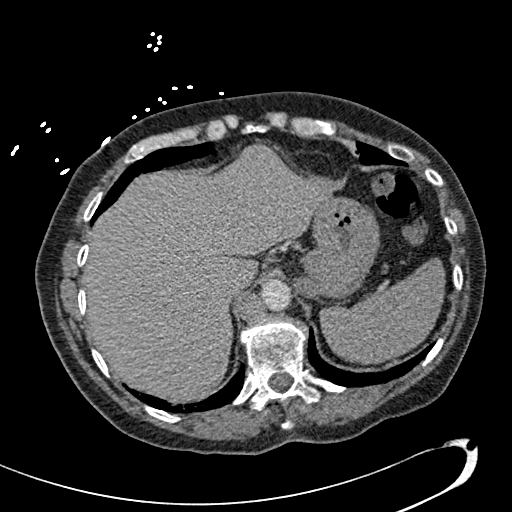
[im 92/301  lung]
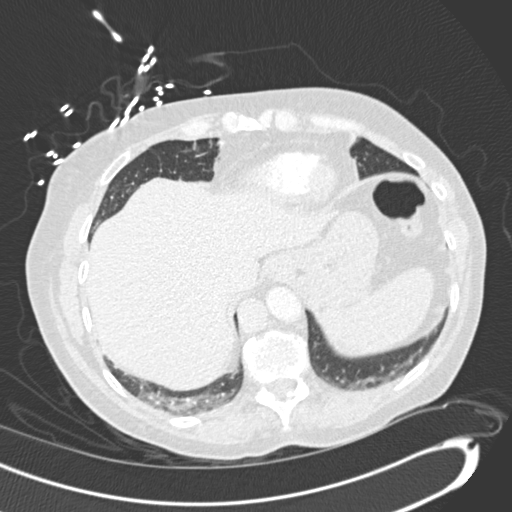
[im 118/301  soft-tissue]
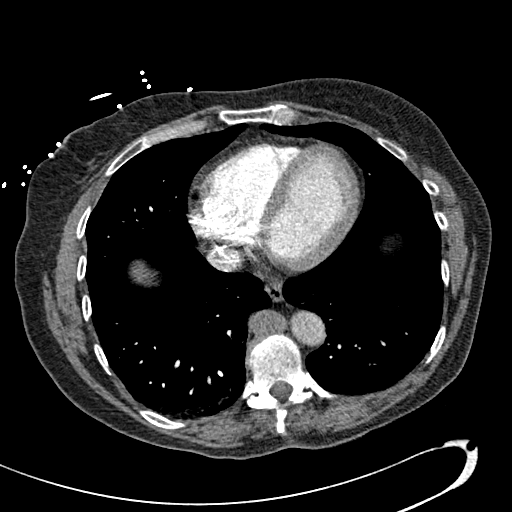
[im 131/301  lung]
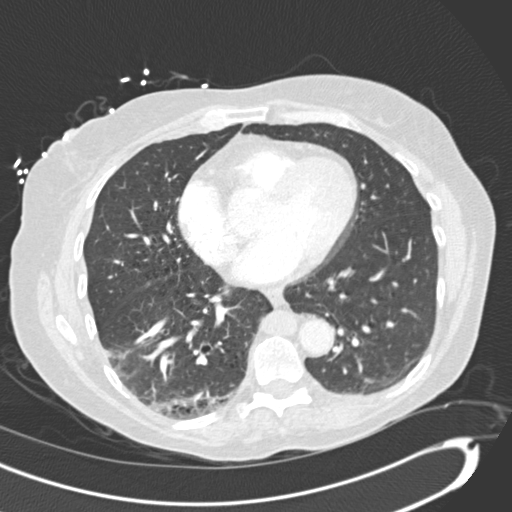
[im 157/301  soft-tissue]
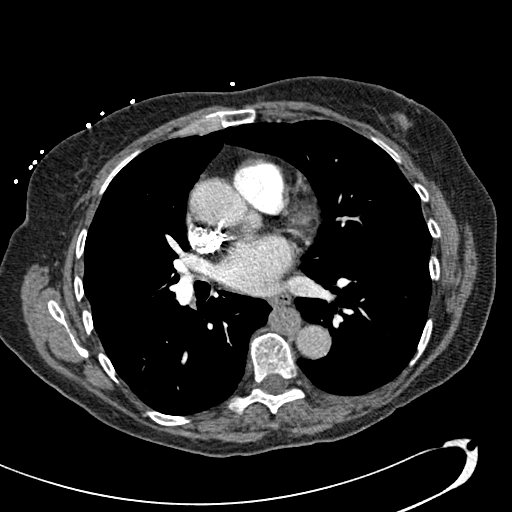
[im 170/301  lung]
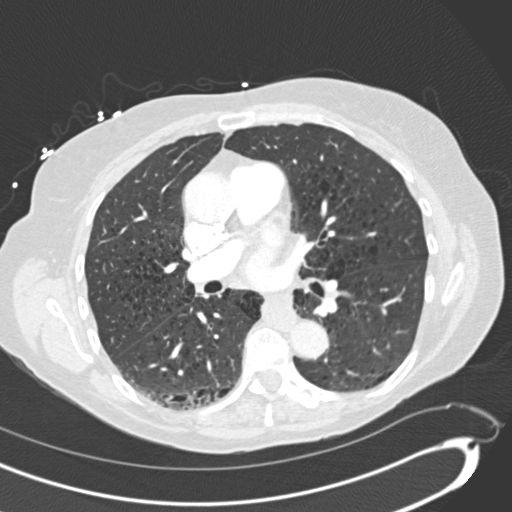
[im 183/301  soft-tissue]
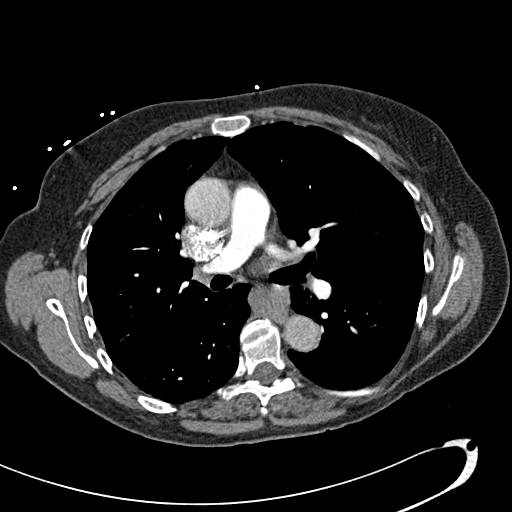
[im 209/301  lung]
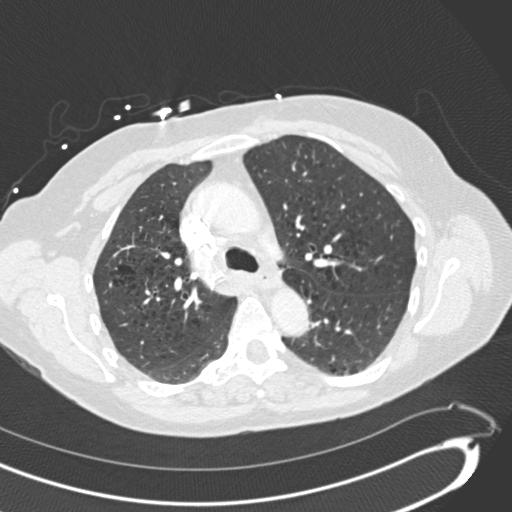
[im 222/301  soft-tissue]
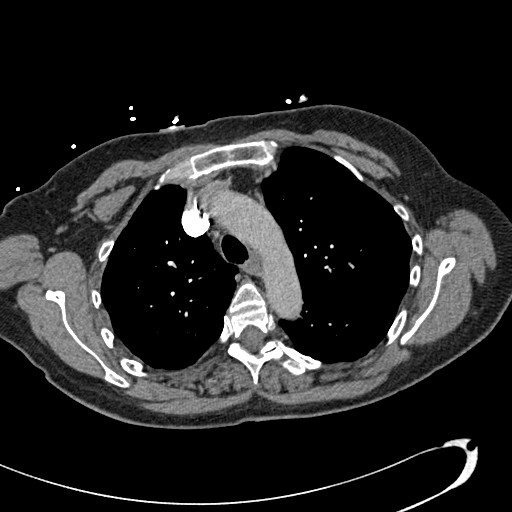
[im 248/301  lung]
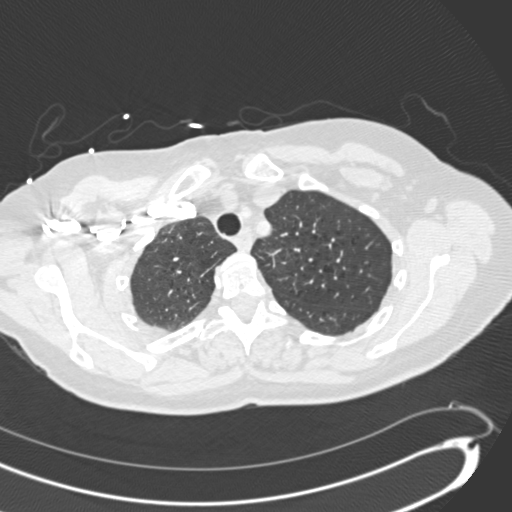
[im 261/301  soft-tissue]
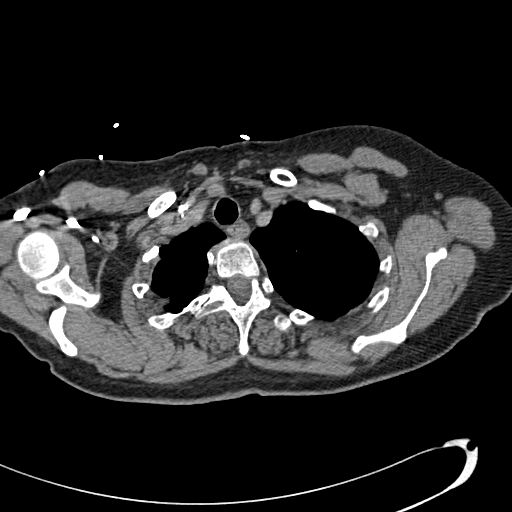
[im 287/301  lung]
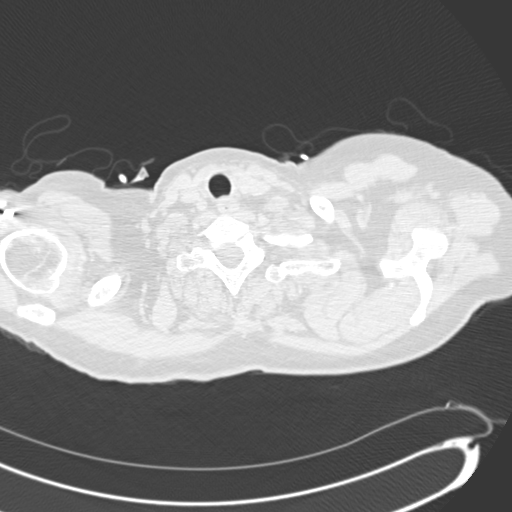

[Series 602: cor · coronal · 0.70mm/px · 3 of 128 slices shown]
[im 32/128  soft-tissue]
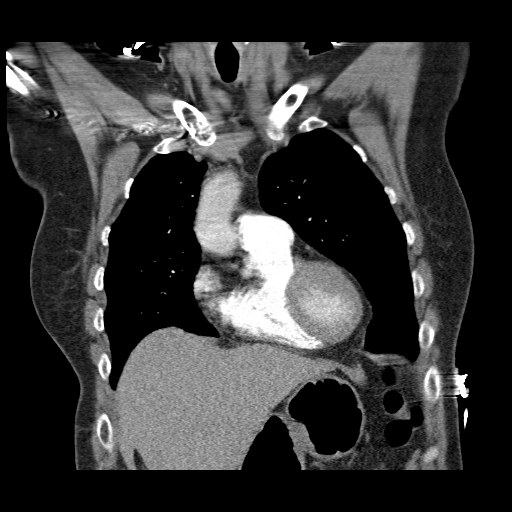
[im 64/128  soft-tissue]
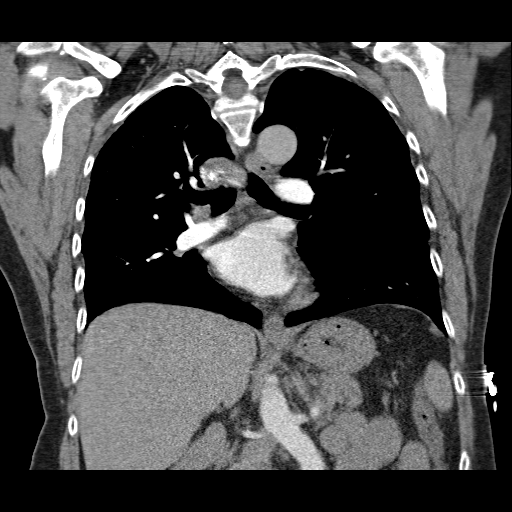
[im 96/128  soft-tissue]
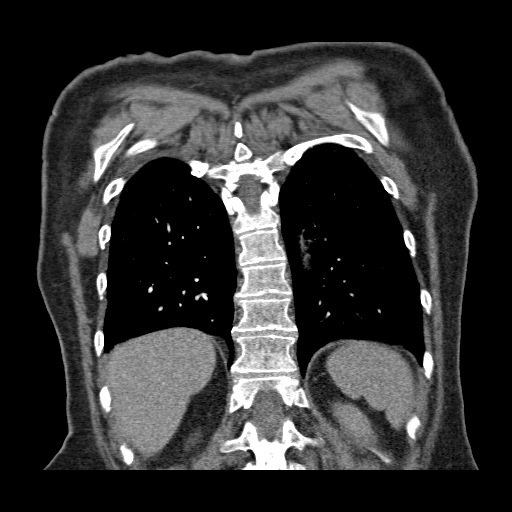

[18 of 46 positions shown; findings below may reference images not displayed]

FINDINGS: This is a technically satisfactory study.

No pulmonary emboli are identified.  There is no evidence of
thoracic aortic aneurysm.
Upper limits normal heart size again noted.

No pleural or pericardial effusions are present.
No enlarged lymph nodes are identified.

Mild to moderate centrilobular and paraseptal emphysema is
identified.
Mild dependent atelectasis/scarring is noted.

A 5 mm right upper lobe noncalcified nodule (image 23) is new since
9003.

There is no evidence of airspace disease, consolidation, mass or
endobronchial/endotracheal lesion.

No acute or suspicious bony abnormalities identified.

Inflammation within the posterior upper left abdomen is noted, of
uncertain origin but adjacent to the proximal stomach and
pancreatic body.
IMPRESSION: Posterior upper left abdominal inflammation of uncertain origin but
adjacent to the proximal stomach and pancreatic body.  This may be
related to a gastric abnormality/ulcer or pancreatitis.

No evidence of pulmonary emboli.

5 mm right upper lobe pulmonary nodule - new since 9003. If the
patient is at high risk for bronchogenic carcinoma, follow-up chest
CT at 6-12 months is recommended.  If the patient is at low risk
for bronchogenic carcinoma, follow-up chest CT at 12 months is
recommended.  This recommendation follows the consensus statement:
Guidelines for Management of Small Pulmonary Nodules Detected on CT
Scans: A Statement from the [HOSPITAL] as published in

Mild to moderate centrilobular emphysema.

## 2014-08-07 ENCOUNTER — Other Ambulatory Visit: Payer: Self-pay | Admitting: Family Medicine

## 2014-08-07 NOTE — Telephone Encounter (Signed)
Needs office follow up  Refill for 30 days.

## 2014-08-07 NOTE — Telephone Encounter (Signed)
Xanax  Last visit 08/27/13 Last refill 05/10/14 #90 0 refill

## 2014-08-17 ENCOUNTER — Other Ambulatory Visit: Payer: Self-pay | Admitting: Family Medicine

## 2014-08-18 ENCOUNTER — Encounter: Payer: Self-pay | Admitting: Family Medicine

## 2014-08-18 ENCOUNTER — Ambulatory Visit (INDEPENDENT_AMBULATORY_CARE_PROVIDER_SITE_OTHER): Payer: Self-pay | Admitting: Family Medicine

## 2014-08-18 VITALS — BP 128/80 | HR 90 | Temp 98.2°F | Wt 161.0 lb

## 2014-08-18 DIAGNOSIS — E038 Other specified hypothyroidism: Secondary | ICD-10-CM

## 2014-08-18 DIAGNOSIS — F411 Generalized anxiety disorder: Secondary | ICD-10-CM

## 2014-08-18 DIAGNOSIS — R030 Elevated blood-pressure reading, without diagnosis of hypertension: Secondary | ICD-10-CM

## 2014-08-18 DIAGNOSIS — G4700 Insomnia, unspecified: Secondary | ICD-10-CM

## 2014-08-18 DIAGNOSIS — F5104 Psychophysiologic insomnia: Secondary | ICD-10-CM

## 2014-08-18 LAB — TSH: TSH: 0.51 u[IU]/mL (ref 0.35–4.50)

## 2014-08-18 MED ORDER — AMITRIPTYLINE HCL 50 MG PO TABS: 50.0000 mg | ORAL_TABLET | Freq: Every day | ORAL | Status: DC

## 2014-08-18 NOTE — Progress Notes (Signed)
Pre visit review using our clinic review tool, if applicable. No additional management support is needed unless otherwise documented below in the visit note. 

## 2014-08-18 NOTE — Progress Notes (Signed)
   Subjective:    Patient ID: Colleen Owen, female    DOB: 05/22/1955, 59 y.o.   MRN: [id number]  HPI Medical follow-up. Following issues were addressed  Hypothyroidism. Compliant with therapy. Needs follow-up labs. She has had some general muscle aches and occasional lethargy. She has lost some weight this past year due to her efforts.  Chronic insomnia. She is on Elavil but would like to try scheduling this back. Currently takes 75 mg at night.  History of chronic anxiety. She takes low-dose alprazolam 0.5 mg one half tablet the morning one half at night until this dosage for many years. No history of misuse.  Prior history of elevated blood pressure but improved with recent weight loss. She is using some type of essential oil she thinks is helping.  Past Medical History  Diagnosis Date  . HYPOTHYROIDISM 09/08/2008  . ANXIETY 09/08/2008  . DEPRESSION 09/08/2008  . HYPERTENSION 09/08/2008  . Acute bronchitis 02/20/2009  . ELEVATED BLOOD PRESSURE 07/16/2009   Past Surgical History  Procedure Laterality Date  . Urethra surgery  2008    reports that she has been smoking Cigarettes.  She has a 67.5 pack-year smoking history. She has never used smokeless tobacco. She reports that she drinks about 3.5 oz of alcohol per week. She reports that she does not use illicit drugs. family history includes Alcohol abuse in her maternal grandfather; Cancer in her mother; Diabetes in her father; Hypertension in her mother; Stroke in her father. Allergies  Allergen Reactions  . Codeine Sulfate Nausea And Vomiting  . Loratadine-Pseudoephedrine Er     "Wired, cannot sleep"      Review of Systems  Constitutional: Negative for fatigue and unexpected weight change.  Eyes: Negative for visual disturbance.  Respiratory: Negative for cough, chest tightness, shortness of breath and wheezing.   Cardiovascular: Negative for chest pain, palpitations and leg swelling.  Endocrine: Negative for polydipsia and  polyuria.  Neurological: Negative for dizziness, seizures, syncope, weakness, light-headedness and headaches.       Objective:   Physical Exam  Constitutional: She appears well-developed and well-nourished. No distress.  HENT:  Mouth/Throat: Oropharynx is clear and moist.  Neck: Neck supple. No thyromegaly present.  Cardiovascular: Normal rate and regular rhythm.   Pulmonary/Chest: Effort normal and breath sounds normal. No respiratory distress. She has no wheezes. She has no rales.  Musculoskeletal: She exhibits no edema.  Lymphadenopathy:    She has no cervical adenopathy.          Assessment & Plan:  #1 hypothyroidism. Recheck TSH #2 chronic insomnia. Try reducing Elavil to 50 mg daily at bedtime with new prescription given #3 chronic anxiety. Recent refill low-dose alprazolam. Avoid escalation #4 history of elevated blood pressure. Improved today with recent weight loss.

## 2014-09-06 ENCOUNTER — Other Ambulatory Visit: Payer: Self-pay | Admitting: Family Medicine

## 2014-09-06 NOTE — Telephone Encounter (Signed)
Xanax  Last visit 08/18/14 Last refill 08/08/14 #30 0 refill

## 2014-09-07 ENCOUNTER — Other Ambulatory Visit: Payer: Self-pay | Admitting: Family Medicine

## 2014-09-07 NOTE — Telephone Encounter (Signed)
Refill thyroid medication for one year and Xanax for 6 months.

## 2014-09-11 ENCOUNTER — Telehealth: Payer: Self-pay | Admitting: Family Medicine

## 2014-09-11 MED ORDER — LEVOTHYROXINE SODIUM 125 MCG PO TABS: ORAL_TABLET | ORAL | Status: DC

## 2014-09-11 NOTE — Telephone Encounter (Signed)
Levothyroxine changed to 90 day. Xanax has already been called in.

## 2014-09-11 NOTE — Telephone Encounter (Signed)
Pt said at her last visit Dr Caryl NeverBurchette told her that her rx would be for 90 days but when she went to pick up from the pharmacy her rx  was only for 30 days    levothyroxine (SYNTHROID, LEVOTHROID) 125 MCG tablet ALPRAZolam (XANAX) 0.5 MG tablet

## 2014-09-26 ENCOUNTER — Telehealth: Payer: Self-pay | Admitting: *Deleted

## 2014-09-26 NOTE — Telephone Encounter (Signed)
Left message for pt to call back.  Need to know if pt had a mammogram within the last year.  If not, okay to refer to [facility]?

## 2014-10-10 ENCOUNTER — Telehealth: Payer: Self-pay | Admitting: Family Medicine

## 2014-10-10 NOTE — Telephone Encounter (Signed)
Pt is requesting that the following med be a o0 day supply going forth. She said it is cheaper for her if the med is 90 days   ALPRAZolam (XANAX) 0.5 MG tablet

## 2014-10-10 NOTE — Telephone Encounter (Signed)
Last visit 08/18/14 Last refill 09/07/14 #30 5 refills

## 2014-10-10 NOTE — Telephone Encounter (Signed)
OK to fill #90 with one refill.  This should last 6 months.

## 2014-10-11 MED ORDER — ALPRAZOLAM 0.5 MG PO TABS: ORAL_TABLET | ORAL | Status: DC

## 2014-10-11 NOTE — Telephone Encounter (Signed)
Rx changed and called into pharmacy  

## 2015-03-13 ENCOUNTER — Encounter (HOSPITAL_COMMUNITY): Payer: Self-pay | Admitting: Emergency Medicine

## 2015-03-13 ENCOUNTER — Emergency Department (HOSPITAL_COMMUNITY)
Admission: EM | Admit: 2015-03-13 | Discharge: 2015-03-13 | Disposition: A | Discharge status: Home / Self Care | Payer: Self-pay | Attending: Emergency Medicine | Admitting: Emergency Medicine

## 2015-03-13 DIAGNOSIS — Z79899 Other long term (current) drug therapy: Secondary | ICD-10-CM | POA: Insufficient documentation

## 2015-03-13 DIAGNOSIS — I10 Essential (primary) hypertension: Secondary | ICD-10-CM | POA: Insufficient documentation

## 2015-03-13 DIAGNOSIS — E039 Hypothyroidism, unspecified: Secondary | ICD-10-CM | POA: Insufficient documentation

## 2015-03-13 DIAGNOSIS — K529 Noninfective gastroenteritis and colitis, unspecified: Secondary | ICD-10-CM | POA: Insufficient documentation

## 2015-03-13 DIAGNOSIS — F329 Major depressive disorder, single episode, unspecified: Secondary | ICD-10-CM | POA: Insufficient documentation

## 2015-03-13 DIAGNOSIS — F419 Anxiety disorder, unspecified: Secondary | ICD-10-CM | POA: Insufficient documentation

## 2015-03-13 DIAGNOSIS — Z720 Tobacco use: Secondary | ICD-10-CM | POA: Insufficient documentation

## 2015-03-13 LAB — CBC WITH DIFFERENTIAL/PLATELET
Basophils Absolute: 0 10*3/uL (ref 0.0–0.1)
Basophils Relative: 1 %
EOS PCT: 4 %
Eosinophils Absolute: 0.3 10*3/uL (ref 0.0–0.7)
HEMATOCRIT: 45.9 % (ref 36.0–46.0)
Hemoglobin: 16.4 g/dL — ABNORMAL HIGH (ref 12.0–15.0)
LYMPHS PCT: 35 %
Lymphs Abs: 2.5 10*3/uL (ref 0.7–4.0)
MCH: 34.7 pg — ABNORMAL HIGH (ref 26.0–34.0)
MCHC: 35.7 g/dL (ref 30.0–36.0)
MCV: 97 fL (ref 78.0–100.0)
MONO ABS: 0.6 10*3/uL (ref 0.1–1.0)
MONOS PCT: 9 %
NEUTROS ABS: 3.7 10*3/uL (ref 1.7–7.7)
Neutrophils Relative %: 51 %
Platelets: 244 10*3/uL (ref 150–400)
RBC: 4.73 MIL/uL (ref 3.87–5.11)
RDW: 12.3 % (ref 11.5–15.5)
WBC: 7 10*3/uL (ref 4.0–10.5)

## 2015-03-13 LAB — BASIC METABOLIC PANEL
Anion gap: 7 (ref 5–15)
BUN: 10 mg/dL (ref 6–20)
CALCIUM: 10 mg/dL (ref 8.9–10.3)
CO2: 28 mmol/L (ref 22–32)
CREATININE: 0.74 mg/dL (ref 0.44–1.00)
Chloride: 104 mmol/L (ref 101–111)
GFR calc Af Amer: >60 mL/min (ref >60)
GFR calc non Af Amer: >60 mL/min (ref >60)
GLUCOSE: 103 mg/dL — AB (ref 65–99)
Potassium: 3.8 mmol/L (ref 3.5–5.1)
Sodium: 139 mmol/L (ref 135–145)

## 2015-03-13 LAB — LIPASE, BLOOD: Lipase: 30 U/L (ref 11–51)

## 2015-03-13 MED ORDER — METRONIDAZOLE 500 MG PO TABS: 500.0000 mg | ORAL_TABLET | Freq: Two times a day (BID) | ORAL | Status: DC

## 2015-03-13 MED ORDER — CIPROFLOXACIN HCL 500 MG PO TABS: 250.0000 mg | ORAL_TABLET | Freq: Two times a day (BID) | ORAL | Status: DC

## 2015-03-13 NOTE — ED Notes (Signed)
Called pt for blood draw, no answer. 

## 2015-03-13 NOTE — ED Provider Notes (Addendum)
CSN: [id number]     Arrival date & time 03/13/15  1633 History   First MD Initiated Contact with Patient 03/13/15 1951     Chief Complaint  Patient presents with  . Diarrhea     (Consider location/radiation/quality/duration/timing/severity/associated sxs/prior Treatment) HPI Comments: Patient here complaining of watery diarrhea 2 weeks. 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 Denies any fever or chills. Characterizes the diarrhea has been watery without blood. Some abdominal cramping but no severe pain. No fever or vomiting. She took a dose of ciprofloxacin as well as Flagyl today and now fills better. 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 She has not followed up with GI due to financial reasons. Nothing makes her symptoms worse.  Patient is a 59 y.o. female presenting with diarrhea. The history is provided by the patient.  Diarrhea   Past Medical History  Diagnosis Date  . HYPOTHYROIDISM 09/08/2008  . ANXIETY 09/08/2008  . DEPRESSION 09/08/2008  . HYPERTENSION 09/08/2008  . Acute bronchitis 02/20/2009  . ELEVATED BLOOD PRESSURE 07/16/2009   Past Surgical History  Procedure Laterality Date  . Urethra surgery  2008   Family History  Problem Relation Age of Onset  . Cancer Mother     cancer  . Hypertension Mother   . Diabetes Father   . Stroke Father   . Alcohol abuse Maternal Grandfather    Social History  Substance Use Topics  . Smoking status: Current Every Day Smoker -- 1.50 packs/day for 45 years    Types: Cigarettes  . Smokeless tobacco: Never Used  . Alcohol Use: 3.5 oz/week    7 drink(s) per week   OB History    No data available     Review of Systems  Gastrointestinal: Positive for diarrhea.  All other systems reviewed and are negative.     Allergies  Codeine sulfate and Loratadine-pseudoephedrine er  Home Medications   Prior to Admission medications   Medication Sig  Start Date End Date Taking? Authorizing Provider  ALPRAZolam (XANAX) 0.5 MG tablet TAKE ONE-HALF TABLET BY MOUTH IN THE MORNING AND TAKE ONE-HALF TABLET BY MOUTH AT BEDTIME 10/11/14  Yes Kristian Covey, MD  amitriptyline (ELAVIL) 50 MG tablet Take 1 tablet (50 mg total) by mouth at bedtime. 08/18/14  Yes Kristian Covey, MD  calcium carbonate (OS-CAL) 600 MG TABS Take 600 mg by mouth daily.    Yes Historical Provider, MD  ciprofloxacin (CIPRO) 250 MG tablet Take 250 mg by mouth once.   Yes Historical Provider, MD  fish oil-omega-3 fatty acids 1000 MG capsule Take 1 g by mouth daily.    Yes Historical Provider, MD  levothyroxine (SYNTHROID, LEVOTHROID) 125 MCG tablet TAKE ONE TABLET BY MOUTH ONCE DAILY. 09/11/14  Yes Kristian Covey, MD  metroNIDAZOLE (FLAGYL) 500 MG tablet Take 500 mg by mouth once.   Yes Historical Provider, MD   BP 159/118 mmHg  Pulse 101  Temp(Src) 98.3 F (36.8 C) (Oral)  Resp 20  SpO2 100% Physical Exam  Constitutional: She is oriented to person, place, and time. She appears well-developed and well-nourished.  Non-toxic appearance. No distress.  HENT:  Head: Normocephalic and atraumatic.  Eyes: Conjunctivae, EOM and lids are normal. Pupils are equal, round, and reactive to light.  Neck: Normal range of motion. Neck supple. No tracheal deviation present. No thyroid mass present.  Cardiovascular: Normal rate, regular rhythm and normal heart sounds.  Exam  reveals no gallop.   No murmur heard. Pulmonary/Chest: Effort normal and breath sounds normal. No stridor. No respiratory distress. She has no decreased breath sounds. She has no wheezes. She has no rhonchi. She has no rales.  Abdominal: Soft. Normal appearance and bowel sounds are normal. She exhibits no distension. There is no tenderness. There is no rebound and no CVA tenderness.  Musculoskeletal: Normal range of motion. She exhibits no edema or tenderness.  Neurological: She is alert and oriented to person, place,  and time. She has normal strength. No cranial nerve deficit or sensory deficit. GCS eye subscore is 4. GCS verbal subscore is 5. GCS motor subscore is 6.  Skin: Skin is warm and dry. No abrasion and no rash noted.  Psychiatric: She has a normal mood and affect. Her speech is normal and behavior is normal.  Nursing note and vitals reviewed.   ED Course  Procedures (including critical care time) Labs Review Labs Reviewed  CBC WITH DIFFERENTIAL/PLATELET - Abnormal; Notable for the following:    Hemoglobin 16.4 (*)    MCH 34.7 (*)    All other components within normal limits  BASIC METABOLIC PANEL - Abnormal; Notable for the following:    Glucose, Bld 103 (*)    All other components within normal limits  LIPASE, BLOOD  URINALYSIS, ROUTINE W REFLEX MICROSCOPIC (NOT AT [facility])    Imaging Review No results found. I have personally reviewed and evaluated these images and lab results as part of my medical decision-making.   EKG Interpretation None      MDM   Final diagnoses:  None    Patient clinically hydrated here. Patient instructed to continue taking her Cipro and Flagyl for possible colitis and to follow-up with her GI doctor. Stool culture obtained here  Lorre NickAnthony Lauryn Lizardi, MD 03/13/15 2009  Lorre NickAnthony James Senn, MD 03/13/15 2011

## 2015-03-13 NOTE — ED Notes (Signed)
MD at bedside. 

## 2015-03-13 NOTE — ED Notes (Signed)
Patient is not able to give stool or urine sample.

## 2015-03-13 NOTE — ED Notes (Signed)
Pt c/o diarrhea since Oct 29, states she thought it would get better but hasn't. Pt states she is dehydrated. Pt also c/o abd pain

## 2015-03-13 NOTE — Discharge Instructions (Signed)
°  Colitis °Colitis is inflammation of the colon. Colitis may last a short time (acute) or it may last a long time (chronic). °CAUSES °This condition may be caused by: °· Viruses. °· Bacteria. °· Reactions to medicine. °· Certain autoimmune diseases, such as Crohn disease or ulcerative colitis. °SYMPTOMS °Symptoms of this condition include: °· Diarrhea. °· Passing bloody or tarry stool. °· Pain. °· Fever. °· Vomiting. °· Tiredness (fatigue). °· Weight loss. °· Bloating. °· Sudden increase in abdominal pain. °· Having fewer bowel movements than usual. °DIAGNOSIS °This condition is diagnosed with a stool test or a blood test. You may also have other tests, including X-rays, a CT scan, or a colonoscopy. °TREATMENT °Treatment may include: °· Resting the bowel. This involves not eating or drinking for a period of time. °· Fluids that are given through an IV tube. °· Medicine for pain and diarrhea. °· Antibiotic medicines. °· Cortisone medicines. °· Surgery. °HOME CARE INSTRUCTIONS °Eating and Drinking °· Follow instructions from your health care provider about eating or drinking restrictions. °· Drink enough fluid to keep your urine clear or pale yellow. °· Work with a dietitian to determine which foods cause your condition to flare up. °· Avoid foods that cause flare-ups. °· Eat a well-balanced diet. °Medicines °· Take over-the-counter and prescription medicines only as told by your health care provider. °· If you were prescribed an antibiotic medicine, take it as told by your health care provider. Do not stop taking the antibiotic even if you start to feel better. °General Instructions °· Keep all follow-up visits as told by your health care provider. This is important. °SEEK MEDICAL CARE IF: °· Your symptoms do not go away. °· You develop new symptoms. °SEEK IMMEDIATE MEDICAL CARE IF: °· You have a fever that does not go away with treatment. °· You develop chills. °· You have extreme weakness, fainting, or  dehydration. °· You have repeated vomiting. °· You develop severe pain in your abdomen. °· You pass bloody or tarry stool. °  °This information is not intended to replace advice given to you by your health care provider. Make sure you discuss any questions you have with your health care provider. °  °Document Released: 05/29/2004 Document Revised: 01/10/2015 Document Reviewed: 08/14/2014 °Elsevier Interactive Patient Education ©2016 [employer] ° °

## 2015-03-13 NOTE — ED Notes (Signed)
Bed: [facility] Expected date:  Expected time:  Means of arrival:  Comments: 

## 2015-05-01 ENCOUNTER — Other Ambulatory Visit: Payer: Self-pay | Admitting: Family Medicine

## 2015-07-02 ENCOUNTER — Other Ambulatory Visit: Payer: Self-pay | Admitting: Family Medicine

## 2015-07-30 ENCOUNTER — Other Ambulatory Visit: Payer: Self-pay | Admitting: Family Medicine

## 2015-10-29 ENCOUNTER — Other Ambulatory Visit: Payer: Self-pay | Admitting: Family Medicine

## 2015-11-02 ENCOUNTER — Ambulatory Visit (INDEPENDENT_AMBULATORY_CARE_PROVIDER_SITE_OTHER): Payer: Self-pay | Admitting: Family Medicine

## 2015-11-02 VITALS — BP 130/90 | HR 94 | Temp 98.3°F | Ht 64.0 in | Wt 155.5 lb

## 2015-11-02 DIAGNOSIS — E038 Other specified hypothyroidism: Secondary | ICD-10-CM

## 2015-11-02 DIAGNOSIS — G4700 Insomnia, unspecified: Secondary | ICD-10-CM

## 2015-11-02 DIAGNOSIS — F5104 Psychophysiologic insomnia: Secondary | ICD-10-CM

## 2015-11-02 DIAGNOSIS — F411 Generalized anxiety disorder: Secondary | ICD-10-CM

## 2015-11-02 LAB — TSH: TSH: 1.01 u[IU]/mL (ref 0.35–4.50)

## 2015-11-02 MED ORDER — ALPRAZOLAM 0.5 MG PO TABS: 0.5000 mg | ORAL_TABLET | Freq: Two times a day (BID) | ORAL | Status: DC | PRN

## 2015-11-02 MED ORDER — LEVOTHYROXINE SODIUM 125 MCG PO TABS: 125.0000 ug | ORAL_TABLET | Freq: Every day | ORAL | Status: DC

## 2015-11-02 MED ORDER — TRAZODONE HCL 50 MG PO TABS: 25.0000 mg | ORAL_TABLET | Freq: Every evening | ORAL | Status: DC | PRN

## 2015-11-02 NOTE — Progress Notes (Signed)
Subjective:    Patient ID: Colleen Owen, female    DOB: 1955/06/10, 60 y.o.   MRN: [id number]  HPI  Patient seen for the following  Hypothyroidism. She is on levothyroxine. Overdue for labs. Compliant with therapy. Denies any fatigue issues.  Chronic insomnia. She was placed on Elavil years ago. She would like to try to come off. We discussed previously try to get her off Elavil because of anticholinergic risks. She has tried things like Benadryl which do not work. She's not tried trazodone.  She has some chronic anxiety and has been on very low-dose alprazolam at night for years.  Past Medical History  Diagnosis Date  . HYPOTHYROIDISM 09/08/2008  . ANXIETY 09/08/2008  . DEPRESSION 09/08/2008  . HYPERTENSION 09/08/2008  . Acute bronchitis 02/20/2009  . ELEVATED BLOOD PRESSURE 07/16/2009   Past Surgical History  Procedure Laterality Date  . Urethra surgery  2008    reports that she has been smoking Cigarettes.  She has a 67.5 pack-year smoking history. She has never used smokeless tobacco. She reports that she drinks about 3.5 oz of alcohol per week. She reports that she does not use illicit drugs. family history includes Alcohol abuse in her maternal grandfather; Cancer in her mother; Diabetes in her father; Hypertension in her mother; Stroke in her father. Allergies  Allergen Reactions  . Codeine Sulfate Nausea And Vomiting  . Loratadine-Pseudoephedrine Er     "Wired, cannot sleep"     Review of Systems  Constitutional: Negative for appetite change, fatigue and unexpected weight change.  Respiratory: Negative for cough.   Cardiovascular: Negative for chest pain.       Objective:   Physical Exam  Constitutional: She appears well-developed and well-nourished.  Neck: Neck supple. No thyromegaly present.  Cardiovascular: Normal rate and regular rhythm.   Pulmonary/Chest: Effort normal and breath sounds normal. No respiratory distress. She has no wheezes. She has no rales.    Musculoskeletal: She exhibits no edema.  Lymphadenopathy:    She has no cervical adenopathy.          Assessment & Plan:  #1 hypothyroidism. Recheck TSH. Refill medication for one year  #2 chronic insomnia. Sleep hygiene discussed. We've recommend tapering Elavil back to 25 mg daily at bedtime for one week then try transitioning over to trazodone 50 mg daily at bedtime and discontinue amitriptyline  Kristian Covey MD [facility]

## 2015-11-02 NOTE — Progress Notes (Signed)
Pre visit review using our clinic review tool, if applicable. No additional management support is needed unless otherwise documented below in the visit note. 

## 2015-11-14 ENCOUNTER — Telehealth: Payer: Self-pay | Admitting: Family Medicine

## 2015-11-14 NOTE — Telephone Encounter (Signed)
Labs drawn on 11/01/2015. Please advise.

## 2015-11-14 NOTE — Telephone Encounter (Signed)
Thyroid was normal range.

## 2015-11-14 NOTE — Telephone Encounter (Signed)
Pt would like blood work results °

## 2015-11-15 NOTE — Telephone Encounter (Signed)
Pt is aware of normal results. She will continue her current dose of medication.

## 2015-12-27 ENCOUNTER — Telehealth: Payer: Self-pay | Admitting: Family Medicine

## 2015-12-27 NOTE — Telephone Encounter (Signed)
Tried calling pt with No Answer. 

## 2015-12-27 NOTE — Telephone Encounter (Signed)
° °  Pt call to say that she would like to discuss the side effects of the meds and would like a call back.

## 2016-01-01 NOTE — Telephone Encounter (Signed)
Left message for patient to call us back.  

## 2016-01-09 NOTE — Telephone Encounter (Signed)
Patient stated that the Trazodone was awful. It made her anxious and angry. Would like to continue on the Amitriptyline if possible, if not is there any thing else she can use to help her sleep? Preferred number is: [phone]

## 2016-01-10 MED ORDER — AMITRIPTYLINE HCL 25 MG PO TABS: 25.0000 mg | ORAL_TABLET | Freq: Every day | ORAL | 3 refills | Status: DC

## 2016-01-10 NOTE — Telephone Encounter (Signed)
Informed patient of med change back to Amitriptyline and that it was sent to pharmacy. Also to stop the Trazodone.

## 2016-01-10 NOTE — Addendum Note (Signed)
Addended by: Roosvelt HarpsHARRIS, Krishna Heuer R on: 01/10/2016 10:27 AM   Modules accepted: Orders

## 2016-01-10 NOTE — Telephone Encounter (Signed)
Stop the Trazodone and take off med list.  Start back Amitriptyline but would try to use lower dose (25 mg qhs) #90 with 3 refills.

## 2016-04-23 ENCOUNTER — Other Ambulatory Visit: Payer: Self-pay | Admitting: Family Medicine

## 2016-04-23 NOTE — Telephone Encounter (Signed)
Last refill and OV 11-02-2015 #90, 1 rf Please advise

## 2016-04-24 NOTE — Telephone Encounter (Signed)
Refill with one additional refill. 

## 2016-06-05 ENCOUNTER — Telehealth: Payer: Self-pay | Admitting: Family Medicine

## 2016-06-05 NOTE — Telephone Encounter (Signed)
Pt states she has been exposed to the flu. Would like to know if Dr Caryl NeverBurchette will call in tami flu. Pt has n symptoms, and aware Dr Caryl NeverBurchette is out of the office today  [facility] [address]

## 2016-06-06 MED ORDER — OSELTAMIVIR PHOSPHATE 75 MG PO CAPS: 75.0000 mg | ORAL_CAPSULE | Freq: Every day | ORAL | 0 refills | Status: DC

## 2016-06-06 NOTE — Telephone Encounter (Signed)
Tamiflu 75 mg once daily for 10 days 

## 2016-06-06 NOTE — Telephone Encounter (Signed)
Medication sent in for patient. 

## 2016-06-06 NOTE — Telephone Encounter (Signed)
Okay 

## 2016-07-09 ENCOUNTER — Telehealth: Payer: Self-pay | Admitting: Family Medicine

## 2016-07-09 NOTE — Telephone Encounter (Signed)
Patient Name: Adolm JosephSUSAN Peil  DOB: 07/09/1955    Initial Comment Caller states that when her grandkids were sick she was prescribed tamiflu, but never took it. For 3 days she has had a fever, body aches, and nausea. She wants to know if it is okay to take the tamiflu or if she needs to be seen. She states that she is trying to avoid coming to the office so she is not exposed to more illnesses.    Nurse Assessment  Nurse: Stefano GaulStringer, RN, Dwana CurdVera Date/Time (Eastern Time): 07/09/2016 8:50:30 AM  Confirm and document reason for call. If symptomatic, describe symptoms. ---Caller states she was exposed to the flu about a month ago. She was prescribed tamiflu and did not take it as she did not have symptoms. She has been recently exposed to the flu. Has had headache since Sunday. No energy. No cough or congestion. No vomiting. She is nauseated She has chills. No thermometer. Symptoms started on Monday.  Does the patient have any new or worsening symptoms? ---Yes  Will a triage be completed? ---Yes  Related visit to physician within the last 2 weeks? ---No  Does the PT have any chronic conditions? (i.e. diabetes, asthma, etc.) ---Yes  List chronic conditions. ---hypothyroidism  Is this a behavioral health or substance abuse call? ---No     Guidelines    Guideline Title Affirmed Question Affirmed Notes  Influenza - Seasonal [1] Using nasal washes and pain medicine > 24 hours AND [2] sinus pain (around cheekbone or eye) persists    Final Disposition User   See Physician within 24 Hours Stringer, RN, Dwana CurdVera    Comments  Pt has tamiflu already and wants to see about starting it rather than coming to the office. Please call pt back regarding medication. If she has to make appt, she is at work, and would like to come from work. Will be at work a couple of more hrs.   Referrals  GO TO FACILITY REFUSED   Disagree/Comply: Disagree  Disagree/Comply Reason: Disagree with instructions

## 2016-07-09 NOTE — Telephone Encounter (Signed)
Spoke with pt and advised that it is recommended to start Tamiflu within 48-72 hours of symptom onset. Pt states that she may take it just to see if it helps. Advised pt to treat symptoms with OTC medications, stay hydrated and contact office if not continuing to improve. Pt voiced understanding. Nothing further needed at this time.

## 2016-10-16 ENCOUNTER — Other Ambulatory Visit: Payer: Self-pay | Admitting: Family Medicine

## 2016-10-17 ENCOUNTER — Encounter: Payer: Self-pay | Admitting: Family Medicine

## 2016-10-17 ENCOUNTER — Ambulatory Visit (INDEPENDENT_AMBULATORY_CARE_PROVIDER_SITE_OTHER): Payer: Self-pay | Admitting: Family Medicine

## 2016-10-17 VITALS — BP 122/84 | HR 95 | Temp 98.6°F | Ht 64.0 in | Wt 155.6 lb

## 2016-10-17 DIAGNOSIS — F411 Generalized anxiety disorder: Secondary | ICD-10-CM

## 2016-10-17 DIAGNOSIS — R1013 Epigastric pain: Secondary | ICD-10-CM

## 2016-10-17 DIAGNOSIS — F5104 Psychophysiologic insomnia: Secondary | ICD-10-CM

## 2016-10-17 DIAGNOSIS — E039 Hypothyroidism, unspecified: Secondary | ICD-10-CM

## 2016-10-17 LAB — TSH: TSH: 0.61 mIU/L

## 2016-10-17 NOTE — Patient Instructions (Signed)
Try OTC Zantac 150 mg or Pepcid 20 mg twice daily Let me know if no better in two weeks.

## 2016-10-17 NOTE — Telephone Encounter (Signed)
Will address at follow up.

## 2016-10-17 NOTE — Telephone Encounter (Signed)
Last rx given on 12/21 for #90 with 1 ref--pt has an appt today also

## 2016-10-17 NOTE — Progress Notes (Signed)
Subjective:     Patient ID: Colleen Owen, female   DOB: 09/21/1955, 61 y.o.   MRN: [id number]  HPI Patient seen for medical follow-up and for new problem as below. She has history of hypothyroidism and needs follow-up lab work. Compliant with therapy. Currently on levothyroxine 125 mcgs daily  She relates for the past couple weeks having some achy discomfort epigastric region. No chest pain. Nonexertional. Symptoms are intermittent. Worse at work and under periods of increased stress. No history of pancreatitis. No history of peptic ulcer disease.  She notes that her symptoms improve when she drinks a glass of wine. No relation to foods. Occasional mild nausea but no vomiting. No appetite change. No weight change. No melena.   Chronic insomnia/chronic anxiety.  She has been on Alprazolam low dose for many years.  Past Medical History:  Diagnosis Date  . Acute bronchitis 02/20/2009  . ANXIETY 09/08/2008  . DEPRESSION 09/08/2008  . ELEVATED BLOOD PRESSURE 07/16/2009  . HYPERTENSION 09/08/2008  . HYPOTHYROIDISM 09/08/2008   Past Surgical History:  Procedure Laterality Date  . URETHRA SURGERY  2008    reports that she has been smoking Cigarettes.  She has a 67.50 pack-year smoking history. She has never used smokeless tobacco. She reports that she drinks about 3.5 oz of alcohol per week . She reports that she does not use drugs. family history includes Alcohol abuse in her maternal grandfather; Cancer in her mother; Diabetes in her father; Hypertension in her mother; Stroke in her father. Allergies  Allergen Reactions  . Codeine Sulfate Nausea And Vomiting  . Loratadine-Pseudoephedrine Er     "Wired, cannot sleep"     Review of Systems  Constitutional: Negative for appetite change and unexpected weight change.  Respiratory: Negative for shortness of breath.   Cardiovascular: Negative for chest pain.  Gastrointestinal: Positive for abdominal pain. Negative for abdominal distention, blood in  stool, constipation, diarrhea and vomiting.       Objective:   Physical Exam  Constitutional: She appears well-developed and well-nourished.  Cardiovascular: Normal rate and regular rhythm.   Pulmonary/Chest: Effort normal and breath sounds normal. No respiratory distress. She has no wheezes. She has no rales.  Abdominal: Soft. She exhibits no distension and no mass. There is tenderness. There is no rebound and no guarding.       Assessment:     #1 Epigastric pain/dyspepsia. Doubt gastritis as symptoms are improved with wine consumption.  She is not have any red flags such as appetite change, weight change, melena, etc.  #2 hypothyroidism  #3 history of chronic anxiety/insomnia    Plan:     -Recheck TSH -Trial of Zantac 150 mg or Pepcid 20 mg twice daily -Prompt follow-up in 2 weeks if not improved with the above -follow up immediately for any fever, melena, increasing pain, or persistent pain, -we discussed trying to taper back Alprazolam as much as possible.  Colleen CoveyBruce W Yaretsi Humphres MD [facility]

## 2016-10-20 NOTE — Telephone Encounter (Signed)
Denied per Burchette.  Message sent to the pharmacy that this will be discussed at the next office visit.

## 2016-10-21 ENCOUNTER — Telehealth: Payer: Self-pay | Admitting: Family Medicine

## 2016-10-21 MED ORDER — ALPRAZOLAM 0.5 MG PO TABS: 0.5000 mg | ORAL_TABLET | Freq: Two times a day (BID) | ORAL | 0 refills | Status: DC | PRN

## 2016-10-21 NOTE — Telephone Encounter (Signed)
Rx called in and patient is aware of lab results

## 2016-10-21 NOTE — Telephone Encounter (Signed)
Pt request refill  ALPRAZolam Prudy Feeler(XANAX) 0.5 MG tablet    [facility] [address] - [place], [place] - [id number]  Also for results of tsh  Please call Cell [phone]

## 2016-10-21 NOTE — Telephone Encounter (Signed)
Refill OK

## 2016-10-21 NOTE — Telephone Encounter (Signed)
Last refill 04/24/16.  Last office visit 10/17/16. -we discussed trying to taper back Alprazolam as much as possible. Okay to fill?

## 2016-11-04 ENCOUNTER — Ambulatory Visit: Payer: Self-pay | Admitting: Family Medicine

## 2016-11-28 ENCOUNTER — Telehealth: Payer: Self-pay | Admitting: Family Medicine

## 2016-11-28 DIAGNOSIS — R1013 Epigastric pain: Secondary | ICD-10-CM

## 2016-11-28 NOTE — Telephone Encounter (Signed)
° ° °  Pt said she meant PET scan cause it shows what is in your body. She said she does not have insurance and she is on a payment plan with [facility].

## 2016-11-28 NOTE — Telephone Encounter (Signed)
PET scan would not be recommended for screening.

## 2016-11-28 NOTE — Telephone Encounter (Signed)
Left message on machine for patient to return our call 

## 2016-11-28 NOTE — Telephone Encounter (Signed)
Patient states her stomach isn't any better.  Because of her family history of cancer she would like to know if a CT scan would show cancer?  Patient will be out of town the week of August 12.

## 2016-11-28 NOTE — Telephone Encounter (Signed)
Pt would like to have a PET scan instead of a Endoscope

## 2016-11-28 NOTE — Telephone Encounter (Signed)
PET scan would not be indicated-nor covered by insurance for screening purposes.  Did she mean CT scan?

## 2016-12-01 NOTE — Telephone Encounter (Signed)
Left message on machine for patient to return our call 

## 2016-12-01 NOTE — Telephone Encounter (Signed)
A CT would be a more reasonable screen.  If her pain persists, we could go ahead and get CT abdomen and pelvis with contrast.

## 2016-12-01 NOTE — Telephone Encounter (Signed)
Patient is aware and order placed 

## 2016-12-25 ENCOUNTER — Other Ambulatory Visit (INDEPENDENT_AMBULATORY_CARE_PROVIDER_SITE_OTHER): Payer: Self-pay

## 2016-12-25 ENCOUNTER — Other Ambulatory Visit: Payer: Self-pay | Admitting: Family Medicine

## 2016-12-25 ENCOUNTER — Other Ambulatory Visit: Payer: Self-pay

## 2016-12-25 DIAGNOSIS — Z01812 Encounter for preprocedural laboratory examination: Secondary | ICD-10-CM

## 2016-12-25 LAB — BASIC METABOLIC PANEL
BUN: 14 mg/dL (ref 6–23)
CALCIUM: 9.6 mg/dL (ref 8.4–10.5)
CO2: 28 mEq/L (ref 19–32)
Chloride: 106 mEq/L (ref 96–112)
Creatinine, Ser: 0.71 mg/dL (ref 0.40–1.20)
GFR: 88.93 mL/min (ref >60.00)
GLUCOSE: 136 mg/dL — AB (ref 70–99)
Potassium: 3.9 mEq/L (ref 3.5–5.1)
SODIUM: 139 meq/L (ref 135–145)

## 2016-12-26 ENCOUNTER — Telehealth: Payer: Self-pay | Admitting: Family Medicine

## 2016-12-26 ENCOUNTER — Ambulatory Visit (INDEPENDENT_AMBULATORY_CARE_PROVIDER_SITE_OTHER)
Admission: RE | Admit: 2016-12-26 | Discharge: 2016-12-26 | Disposition: A | Discharge status: Home / Self Care | Payer: Self-pay | Source: Ambulatory Visit | Attending: Family Medicine | Admitting: Family Medicine

## 2016-12-26 DIAGNOSIS — R1013 Epigastric pain: Secondary | ICD-10-CM

## 2016-12-26 MED ORDER — IOPAMIDOL (ISOVUE-300) INJECTION 61%: 100.0000 mL | Freq: Once | INTRAVENOUS | Status: DC | PRN

## 2016-12-26 NOTE — Telephone Encounter (Signed)
Pt had abd  ct scan at [address] this morning and would like results today

## 2016-12-26 NOTE — Telephone Encounter (Signed)
Answered under result note.  

## 2016-12-29 NOTE — Telephone Encounter (Signed)
Pt can not afford to go to GI due to no insurance

## 2017-01-01 ENCOUNTER — Inpatient Hospital Stay: Admission: RE | Admit: 2017-01-01 | Payer: Self-pay | Source: Ambulatory Visit

## 2017-01-21 ENCOUNTER — Other Ambulatory Visit: Payer: Self-pay | Admitting: Family Medicine

## 2017-01-21 NOTE — Telephone Encounter (Signed)
Refill once 

## 2017-01-21 NOTE — Telephone Encounter (Signed)
Last refill 10/21/16 and last office visit 10/17/16.  Okay to fill?

## 2017-01-22 ENCOUNTER — Other Ambulatory Visit: Payer: Self-pay | Admitting: Family Medicine

## 2017-01-26 NOTE — Telephone Encounter (Signed)
Last refill 01/22/17 and last office visit 10/17/16.  Okay to fill? 

## 2017-01-26 NOTE — Telephone Encounter (Signed)
Clarify.  Looks like refilled 01-22-17??

## 2017-02-26 ENCOUNTER — Other Ambulatory Visit: Payer: Self-pay | Admitting: Family Medicine

## 2017-02-26 NOTE — Telephone Encounter (Signed)
Rx done. 

## 2017-02-26 NOTE — Telephone Encounter (Signed)
Last rx given on 01/10/2016 for #90 with 3 ref

## 2017-02-26 NOTE — Telephone Encounter (Signed)
Refill for 6 months. 

## 2017-04-21 ENCOUNTER — Other Ambulatory Visit: Payer: Self-pay | Admitting: Family Medicine

## 2017-04-22 NOTE — Telephone Encounter (Signed)
Last refill 01/22/17 and last office visit 10/17/16.  Okay to fill?

## 2017-04-23 NOTE — Telephone Encounter (Signed)
Refill once 

## 2017-04-23 NOTE — Telephone Encounter (Signed)
Rx done. 

## 2017-06-25 ENCOUNTER — Other Ambulatory Visit: Payer: Self-pay | Admitting: Family Medicine

## 2017-06-26 ENCOUNTER — Other Ambulatory Visit: Payer: Self-pay | Admitting: Family Medicine

## 2017-07-17 ENCOUNTER — Other Ambulatory Visit: Payer: Self-pay | Admitting: Family Medicine

## 2017-07-17 NOTE — Telephone Encounter (Signed)
Refill once 

## 2017-07-17 NOTE — Telephone Encounter (Signed)
Last refill 10/17/17 okay to fill?

## 2017-08-24 ENCOUNTER — Other Ambulatory Visit: Payer: Self-pay | Admitting: Family Medicine

## 2017-10-09 ENCOUNTER — Encounter: Payer: Self-pay | Admitting: Family Medicine

## 2017-10-09 ENCOUNTER — Ambulatory Visit (INDEPENDENT_AMBULATORY_CARE_PROVIDER_SITE_OTHER): Payer: Self-pay | Admitting: Family Medicine

## 2017-10-09 VITALS — BP 110/88 | HR 103 | Temp 98.5°F | Wt 143.7 lb

## 2017-10-09 DIAGNOSIS — R5383 Other fatigue: Secondary | ICD-10-CM

## 2017-10-09 DIAGNOSIS — F5104 Psychophysiologic insomnia: Secondary | ICD-10-CM

## 2017-10-09 DIAGNOSIS — E038 Other specified hypothyroidism: Secondary | ICD-10-CM

## 2017-10-09 LAB — CBC WITH DIFFERENTIAL/PLATELET
BASOS PCT: 0.8 % (ref 0.0–3.0)
Basophils Absolute: 0.1 10*3/uL (ref 0.0–0.1)
EOS ABS: 0.2 10*3/uL (ref 0.0–0.7)
Eosinophils Relative: 1.8 % (ref 0.0–5.0)
HCT: 44.8 % (ref 36.0–46.0)
Hemoglobin: 15.5 g/dL — ABNORMAL HIGH (ref 12.0–15.0)
LYMPHS ABS: 1.5 10*3/uL (ref 0.7–4.0)
Lymphocytes Relative: 14.1 % (ref 12.0–46.0)
MCHC: 34.5 g/dL (ref 30.0–36.0)
MCV: 98.5 fl (ref 78.0–100.0)
MONO ABS: 0.7 10*3/uL (ref 0.1–1.0)
Monocytes Relative: 6.6 % (ref 3.0–12.0)
NEUTROS ABS: 8.3 10*3/uL — AB (ref 1.4–7.7)
Neutrophils Relative %: 76.7 % (ref 43.0–77.0)
PLATELETS: 242 10*3/uL (ref 150.0–400.0)
RBC: 4.55 Mil/uL (ref 3.87–5.11)
RDW: 12.6 % (ref 11.5–15.5)
WBC: 10.8 10*3/uL — AB (ref 4.0–10.5)

## 2017-10-09 LAB — BASIC METABOLIC PANEL
BUN: 6 mg/dL (ref 6–23)
CHLORIDE: 100 meq/L (ref 96–112)
CO2: 29 meq/L (ref 19–32)
CREATININE: 0.6 mg/dL (ref 0.40–1.20)
Calcium: 9.5 mg/dL (ref 8.4–10.5)
GFR: 107.72 mL/min (ref >60.00)
GLUCOSE: 86 mg/dL (ref 70–99)
Potassium: 4.5 mEq/L (ref 3.5–5.1)
Sodium: 138 mEq/L (ref 135–145)

## 2017-10-09 LAB — TSH: TSH: 1.28 u[IU]/mL (ref 0.35–4.50)

## 2017-10-09 NOTE — Patient Instructions (Signed)
Start OTC B12 1,000 mcg daily  Consider OTC Vit D 1,000 IU daily.  Consider decrease the Elavil to one half at night and then consider discontinue in 2-3 weeks.   Consider OTC Flonase once daily for each nostril.

## 2017-10-09 NOTE — Progress Notes (Signed)
Subjective:     Patient ID: Colleen Owen, female   DOB: 07/14/1955, 62 y.o.   MRN: [id number]  HPI Patient is here nonspecific place of headache and fatigue. She states there was installation of new air conditioning unit at her work recently and she thinks there may be mold issues. She's not aware of any specific mold testing. She's had nonspecific complaints of fatigue, clear rhinorrhea, intermittent headaches, chills without fever. Occasional cough  She has hypothyroidism and is due for follow-up labs. Compliant with therapy. Past history of depression currently stable. Long history of smoking. No recent hemoptysis. Appetite and weight stable.  Pt has hx of chronic insomnia.  On Elavil for years.  She would like to taper off.  Past Medical History:  Diagnosis Date  . Acute bronchitis 02/20/2009  . ANXIETY 09/08/2008  . DEPRESSION 09/08/2008  . ELEVATED BLOOD PRESSURE 07/16/2009  . HYPERTENSION 09/08/2008  . HYPOTHYROIDISM 09/08/2008   Past Surgical History:  Procedure Laterality Date  . URETHRA SURGERY  2008    reports that she has been smoking cigarettes.  She has a 67.50 pack-year smoking history. She has never used smokeless tobacco. She reports that she drinks about 3.5 oz of alcohol per week. She reports that she does not use drugs. family history includes Alcohol abuse in her maternal grandfather; Cancer in her mother; Diabetes in her father; Hypertension in her mother; Stroke in her father. Allergies  Allergen Reactions  . Codeine Sulfate Nausea And Vomiting  . Loratadine-Pseudoephedrine Er     "Wired, cannot sleep"     Review of Systems  Constitutional: Positive for chills and fatigue. Negative for appetite change and fever.  HENT: Positive for congestion and rhinorrhea.   Respiratory: Positive for cough. Negative for shortness of breath and wheezing.   Gastrointestinal: Negative for abdominal pain, constipation, diarrhea, nausea and vomiting.  Genitourinary: Negative for  dysuria and frequency.  Musculoskeletal: Negative for back pain.  Neurological: Negative for dizziness.       Objective:   Physical Exam  Constitutional: She appears well-developed and well-nourished.  HENT:  Head: Normocephalic and atraumatic.  Neck: Neck supple. No thyromegaly present.  Cardiovascular: Normal rate, regular rhythm and normal heart sounds.  Pulmonary/Chest: Breath sounds normal.  Abdominal: Soft. Bowel sounds are normal. There is no tenderness.       Assessment:     #1 hypothyroidism  #2 nonspecific symptoms of fatigue, cough, chills, headache. Patient is concerned about possible mold allergy  #3 chronic insomnia     Plan:     -Check labs with TSH, CBC, basic metabolic panel -We explained that there is not good evidence for specific testing for molds. We have recommended she consider trying over-the-counter Flonase to see if this helps if there is an allergic basis for her symptoms -decrease Elavil 25 mg to one half tablet at night for 2-3 weeks and then try to d/c.    Kristian Covey MD [facility]

## 2017-10-16 ENCOUNTER — Other Ambulatory Visit: Payer: Self-pay | Admitting: Family Medicine

## 2017-10-16 NOTE — Telephone Encounter (Signed)
Last OV 10/09/17 and last refill 07/17/17.  Okay to fill?

## 2017-10-19 NOTE — Telephone Encounter (Signed)
Refill OK

## 2018-01-11 ENCOUNTER — Other Ambulatory Visit: Payer: Self-pay | Admitting: Family Medicine

## 2018-01-11 NOTE — Telephone Encounter (Signed)
Last refill 10/19/17 and last office visit 10/09/17. Okay to fill?

## 2018-01-11 NOTE — Telephone Encounter (Signed)
Refill with one additional refill. 

## 2018-01-12 NOTE — Telephone Encounter (Signed)
Rx done. 

## 2018-02-23 ENCOUNTER — Other Ambulatory Visit: Payer: Self-pay | Admitting: Family Medicine

## 2018-02-23 ENCOUNTER — Encounter: Payer: Self-pay | Admitting: Gastroenterology

## 2018-02-23 NOTE — Telephone Encounter (Signed)
Last OV 10/09/17, No future OV  Last filled 08/24/17, # 90 with 1 refill  Per notes from OV from 10/09/17: -decrease Elavil 25 mg to one half tablet at night for 2-3 weeks and then try to d/c.    Please advise if okay to use this dosage then d/c.

## 2018-02-23 NOTE — Telephone Encounter (Signed)
She apparently was unable to taper off.  Refill for 6 months.

## 2018-03-19 ENCOUNTER — Other Ambulatory Visit: Payer: Self-pay | Admitting: Family Medicine

## 2018-03-24 ENCOUNTER — Ambulatory Visit: Payer: Self-pay | Admitting: Gastroenterology

## 2018-07-09 ENCOUNTER — Other Ambulatory Visit: Payer: Self-pay | Admitting: Family Medicine

## 2018-07-09 NOTE — Telephone Encounter (Signed)
Last OV 10/09/17, No future OV  Last filled 01/12/18, # 90 with 1 refill

## 2018-07-09 NOTE — Telephone Encounter (Signed)
Refill once.  Will need follow up by June.

## 2018-09-13 ENCOUNTER — Other Ambulatory Visit: Payer: Self-pay | Admitting: Family Medicine

## 2018-09-13 NOTE — Telephone Encounter (Signed)
Patient has a [facility] telephone appointment on 09/14/18 at 11 am with Dr. Caryl Never for medication refills.

## 2018-09-14 ENCOUNTER — Other Ambulatory Visit: Payer: Self-pay

## 2018-09-14 ENCOUNTER — Telehealth: Payer: Self-pay

## 2018-09-14 ENCOUNTER — Ambulatory Visit (INDEPENDENT_AMBULATORY_CARE_PROVIDER_SITE_OTHER): Payer: Self-pay | Admitting: Family Medicine

## 2018-09-14 DIAGNOSIS — F411 Generalized anxiety disorder: Secondary | ICD-10-CM

## 2018-09-14 DIAGNOSIS — F5104 Psychophysiologic insomnia: Secondary | ICD-10-CM

## 2018-09-14 DIAGNOSIS — E038 Other specified hypothyroidism: Secondary | ICD-10-CM

## 2018-09-14 MED ORDER — LEVOTHYROXINE SODIUM 125 MCG PO TABS: 125.0000 ug | ORAL_TABLET | Freq: Every day | ORAL | 1 refills | Status: DC

## 2018-09-14 MED ORDER — ALPRAZOLAM 0.5 MG PO TABS: 0.5000 mg | ORAL_TABLET | Freq: Two times a day (BID) | ORAL | 0 refills | Status: DC | PRN

## 2018-09-14 MED ORDER — AMITRIPTYLINE HCL 25 MG PO TABS: 25.0000 mg | ORAL_TABLET | Freq: Every day | ORAL | 1 refills | Status: DC

## 2018-09-14 NOTE — Telephone Encounter (Signed)
Called patient and left a detailed voice message to let patient know that Dr. Caryl Never is running a little behind and that he will send her a text message as soon as he finishes with a patient.

## 2018-09-14 NOTE — Progress Notes (Signed)
Patient ID: Colleen Owen, female   DOB: 12/30/1955, 63 y.o.   MRN: [id number]  This visit type was conducted due to national recommendations for restrictions regarding the COVID-19 pandemic in an effort to limit this patient's exposure and mitigate transmission in our community.   Virtual Visit via Video Note  I connected with Colleen Owen on 09/14/18 at 11:00 AM EDT by a video enabled telemedicine application and verified that I am speaking with the correct person using two identifiers.  Location patient: home Location provider:work or home office Persons participating in the virtual visit: patient, provider  I discussed the limitations of evaluation and management by telemedicine and the availability of in person appointments. The patient expressed understanding and agreed to proceed.   HPI: Patient needs refills of several medications.  She has been on amitriptyline 25 mg at night for years.  She takes low-dose alprazolam.  She has hypothyroidism treated with levothyroxine 125 mcg daily.  She has been compliant with therapy.  She states that she has made some dietary changes over the past year and feels much better overall.  She states she is lost 30 pounds.  She was having some chronic GI upset and eliminated wine.  She had only been drinking a few ounces per day but feels that she may have had some sort of allergic reaction.  She also has reduced carbs and increase her protein intake and has more energy.  Good appetite.  Compliant with medications.  She will be due for lab work soon.  She declines coming in at this point and prefers to wait a few more months if possible   ROS: See pertinent positives and negatives per HPI.  Past Medical History:  Diagnosis Date  . Acute bronchitis 02/20/2009  . ANXIETY 09/08/2008  . DEPRESSION 09/08/2008  . ELEVATED BLOOD PRESSURE 07/16/2009  . HYPERTENSION 09/08/2008  . HYPOTHYROIDISM 09/08/2008    Past Surgical History:  Procedure Laterality Date  .  URETHRA SURGERY  2008    Family History  Problem Relation Age of Onset  . Cancer Mother        cancer  . Hypertension Mother   . Diabetes Father   . Stroke Father   . Alcohol abuse Maternal Grandfather     SOCIAL HX: Long history of smoking.  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  Current Outpatient Medications:  .  ALPRAZolam (XANAX) 0.5 MG tablet, Take 1 tablet (0.5 mg total) by mouth 2 (two) times daily as needed. for anxiety, Disp: 90 tablet, Rfl: 0 .  amitriptyline (ELAVIL) 25 MG tablet, Take 1 tablet (25 mg total) by mouth at bedtime., Disp: 90 tablet, Rfl: 1 .  calcium carbonate (OS-CAL) 600 MG TABS, Take 600 mg by mouth daily. , Disp: , Rfl:  .  fish oil-omega-3 fatty acids 1000 MG capsule, Take 1 g by mouth daily. , Disp: , Rfl:  .  levothyroxine (SYNTHROID) 125 MCG tablet, Take 1 tablet (125 mcg total) by mouth daily., Disp: 90 tablet, Rfl: 1 .  Multiple Vitamin (MULTIVITAMIN) tablet, Take 1 tablet by mouth daily., Disp: , Rfl:  .  Probiotic Product (PROBIOTIC-10 PO), Take by mouth., Disp: , Rfl:   EXAM:  VITALS per patient if applicable:  GENERAL: alert, oriented, appears well and in no acute distress  HEENT: atraumatic, conjunttiva clear, no obvious abnormalities on inspection of external nose and ears  NECK: normal movements of the head and neck  LUNGS: on inspection no signs of respiratory distress, breathing rate  appears normal, no obvious gross SOB, gasping or wheezing  CV: no obvious cyanosis  MS: moves all visible extremities without noticeable abnormality  PSYCH/NEURO: pleasant and cooperative, no obvious depression or anxiety, speech and thought processing grossly intact  ASSESSMENT AND PLAN:  Discussed the following assessment and plan:  #1 hypothyroidism -Refill levothyroxine for 1 year -We will plan on lab work in about 3 months  #2 history of chronic anxiety -Refilled her alprazolam     I discussed the assessment and treatment plan  with the patient. The patient was provided an opportunity to ask questions and all were answered. The patient agreed with the plan and demonstrated an understanding of the instructions.   The patient was advised to call back or seek an in-person evaluation if the symptoms worsen or if the condition fails to improve as anticipated.     Evelena Peat, MD

## 2018-12-27 ENCOUNTER — Other Ambulatory Visit: Payer: Self-pay | Admitting: Family Medicine

## 2018-12-27 NOTE — Telephone Encounter (Signed)
Last OV 09/14/18, No future OV  Last filled 09/14/18, # 90 with 0 refills

## 2018-12-28 NOTE — Telephone Encounter (Signed)
Pt needs follow up labs.  I refilled med once    Set up follow up in next couple of months.

## 2019-01-25 ENCOUNTER — Telehealth: Payer: Self-pay

## 2019-01-25 DIAGNOSIS — E038 Other specified hypothyroidism: Secondary | ICD-10-CM

## 2019-01-25 NOTE — Telephone Encounter (Signed)
Does she need a CBC and/or BMP?

## 2019-01-25 NOTE — Telephone Encounter (Signed)
Lab ordered.

## 2019-01-25 NOTE — Telephone Encounter (Signed)
no

## 2019-01-25 NOTE — Telephone Encounter (Signed)
Please advise.  Copied from [place] [phone]. Topic: General - Other >> Jan 25, 2019  9:51 AM Leward Quan A wrote: Reason for CRM: Patient called to schedule for labs but no orders were in asking if Dr Elease Hashimoto could place her lab orders and have nurse call her to schedule. Ph#  [phone]

## 2019-01-26 NOTE — Telephone Encounter (Signed)
Patient is scheduled for 01/28/19

## 2019-01-28 ENCOUNTER — Other Ambulatory Visit (INDEPENDENT_AMBULATORY_CARE_PROVIDER_SITE_OTHER): Payer: Self-pay

## 2019-01-28 ENCOUNTER — Other Ambulatory Visit: Payer: Self-pay

## 2019-01-28 DIAGNOSIS — E038 Other specified hypothyroidism: Secondary | ICD-10-CM

## 2019-01-29 LAB — TSH: TSH: 0.39 mIU/L — ABNORMAL LOW (ref 0.40–4.50)

## 2019-02-01 ENCOUNTER — Other Ambulatory Visit: Payer: Self-pay | Admitting: Family Medicine

## 2019-02-01 MED ORDER — LEVOTHYROXINE SODIUM 112 MCG PO TABS: 112.0000 ug | ORAL_TABLET | Freq: Every day | ORAL | 0 refills | Status: DC

## 2019-03-23 ENCOUNTER — Other Ambulatory Visit: Payer: Self-pay | Admitting: Family Medicine

## 2019-04-26 ENCOUNTER — Other Ambulatory Visit: Payer: Self-pay

## 2019-04-26 ENCOUNTER — Other Ambulatory Visit (INDEPENDENT_AMBULATORY_CARE_PROVIDER_SITE_OTHER): Payer: Self-pay

## 2019-04-26 ENCOUNTER — Telehealth: Payer: Self-pay | Admitting: Family Medicine

## 2019-04-26 DIAGNOSIS — E038 Other specified hypothyroidism: Secondary | ICD-10-CM

## 2019-04-26 LAB — TSH: TSH: 2.13 u[IU]/mL (ref 0.35–4.50)

## 2019-04-26 MED ORDER — LEVOTHYROXINE SODIUM 112 MCG PO TABS: 112.0000 ug | ORAL_TABLET | Freq: Every day | ORAL | 3 refills | Status: DC

## 2019-04-26 NOTE — Telephone Encounter (Signed)
Medication has been sent.  

## 2019-04-26 NOTE — Telephone Encounter (Signed)
OK to continue same dosage of Levothyroxine?

## 2019-04-26 NOTE — Telephone Encounter (Signed)
Patient wants to know lab results, however she wants her medication to go ahead and be called in to [facility] on [address] as soon as possible after lab results.  She is running low on her medication.

## 2019-04-26 NOTE — Telephone Encounter (Signed)
[place] patient. I do not see that the patients labs have come back yet. Please advise.

## 2019-04-26 NOTE — Telephone Encounter (Signed)
Same dose thyroid med and refill for one year.

## 2019-06-25 ENCOUNTER — Other Ambulatory Visit: Payer: Self-pay | Admitting: Family Medicine

## 2019-09-26 ENCOUNTER — Other Ambulatory Visit: Payer: Self-pay | Admitting: Family Medicine

## 2019-09-26 NOTE — Telephone Encounter (Signed)
Needs office follow up.  Will refill once.

## 2019-09-26 NOTE — Telephone Encounter (Signed)
Please advise last ov: 09/22/2018

## 2019-10-25 ENCOUNTER — Other Ambulatory Visit: Payer: Self-pay

## 2019-10-25 ENCOUNTER — Ambulatory Visit (INDEPENDENT_AMBULATORY_CARE_PROVIDER_SITE_OTHER): Payer: Self-pay | Admitting: Family Medicine

## 2019-10-25 ENCOUNTER — Encounter: Payer: Self-pay | Admitting: Family Medicine

## 2019-10-25 VITALS — BP 134/88 | HR 96 | Temp 98.3°F | Wt 136.8 lb

## 2019-10-25 DIAGNOSIS — R197 Diarrhea, unspecified: Secondary | ICD-10-CM | POA: Insufficient documentation

## 2019-10-25 DIAGNOSIS — F5104 Psychophysiologic insomnia: Secondary | ICD-10-CM

## 2019-10-25 DIAGNOSIS — E038 Other specified hypothyroidism: Secondary | ICD-10-CM

## 2019-10-25 MED ORDER — AMITRIPTYLINE HCL 25 MG PO TABS: 25.0000 mg | ORAL_TABLET | Freq: Every day | ORAL | 3 refills | Status: AC

## 2019-10-25 NOTE — Progress Notes (Signed)
Established Patient Office Visit  Subjective:  Patient ID: Colleen Owen, female    DOB: 01/30/1956  Age: 64 y.o. MRN: [id number]  CC:  Chief Complaint  Patient presents with   Medication Refill    Pt is here for medication refill on amtriptaline and xanax     HPI Colleen Owen presents for medical follow-up.  She needs refills of amitriptyline. She has been on this for many years and we actually have been able to taper her dose back from what she was originally on. We had expressed our concerns for oral anticholinergic medications for chronic use. She has had tremendous difficulties falling asleep without this. She denies any constipation issues. In fact, she has had several years of frequent loose stools but worsening over the past few weeks. No bloody stools. Last colonoscopy apparently was around 2007. She currently has no insurance coverage and declines referral. No history of known celiac disease. She is occasionally avoided lactose products but has not seen much improvement. Her number of stools varies considerably from 0 to sometimes 8 or 9/day. She notices that eating just about any kind of food seems to trigger more frequent stools. Her appetite and weight have been stable  She has hypothyroidism and is on replacement. She had labs done back in December and this was stable.  She is taken alprazolam for several years at night for sleep. She had recent refills. No recent falls.  Still smoking about 1 pack cigarettes per day. Fairly low motivation to quit. She has not had a physical in years. She is waiting basically to get Medicare coverage because of very high deductible with her current plan  Past Medical History:  Diagnosis Date   Acute bronchitis 02/20/2009   ANXIETY 09/08/2008   DEPRESSION 09/08/2008   ELEVATED BLOOD PRESSURE 07/16/2009   HYPERTENSION 09/08/2008   HYPOTHYROIDISM 09/08/2008    Past Surgical History:  Procedure Laterality Date   URETHRA SURGERY  2008     Family History  Problem Relation Age of Onset   Cancer Mother        cancer   Hypertension Mother    Diabetes Father    Stroke Father    Alcohol abuse Maternal Grandfather     Social History   Socioeconomic History   Marital status: Legally Separated    Spouse name: Not on file   Number of children: Not on file   Years of education: Not on file   Highest education level: Not on file  Occupational History   Not on file  Tobacco Use   Smoking status: Current Every Day Smoker    Packs/day: 1.50    Years: 45.00    Pack years: 67.50    Types: Cigarettes   Smokeless tobacco: Never Used  Substance and Sexual Activity   Alcohol use: Yes    Alcohol/week: 7.0 standard drinks    Types: 7 drink(s) per week   Drug use: No   Sexual activity: Not on file  Other Topics Concern   Not on file  Social History Narrative   Not on file   Social Determinants of Health   Financial Resource Strain:    Difficulty of Paying Living Expenses:   Food Insecurity:    Worried About Charity fundraiser in the Last Year:    Arboriculturist in the Last Year:   Transportation Needs:    Film/video editor (Medical):    Lack of Transportation (Non-Medical):   Physical Activity:  Days of Exercise per Week:    Minutes of Exercise per Session:   Stress:    Feeling of Stress :   Social Connections:    Frequency of Communication with Friends and Family:    Frequency of Social Gatherings with Friends and Family:    Attends Religious Services:    Active Member of Clubs or Organizations:    Attends Engineer, structural:    Marital Status:   Intimate Partner Violence:    Fear of Current or Ex-Partner:    Emotionally Abused:    Physically Abused:    Sexually Abused:     Outpatient Medications Prior to Visit  Medication Sig Dispense Refill   ALPRAZolam (XANAX) 0.5 MG tablet Take 1 tablet by mouth twice daily as needed for anxiety 90 tablet 0    calcium carbonate (OS-CAL) 600 MG TABS Take 600 mg by mouth daily.      fish oil-omega-3 fatty acids 1000 MG capsule Take 1 g by mouth daily.      levothyroxine (SYNTHROID) 112 MCG tablet Take 1 tablet (112 mcg total) by mouth daily. 90 tablet 3   Multiple Vitamin (MULTIVITAMIN) tablet Take 1 tablet by mouth daily.     Probiotic Product (PROBIOTIC-10 PO) Take by mouth.     amitriptyline (ELAVIL) 25 MG tablet Take 1 tablet (25 mg total) by mouth at bedtime. NEEDS ApPT FOR FURTHER REFILLS 30 tablet 0   No facility-administered medications prior to visit.    Allergies  Allergen Reactions   Codeine Sulfate Nausea And Vomiting   Loratadine-Pseudoephedrine Er     "Wired, cannot sleep"    ROS Review of Systems  Constitutional: Negative for appetite change, chills, fever and unexpected weight change.  Respiratory: Negative for cough and shortness of breath.   Cardiovascular: Negative for chest pain.  Gastrointestinal: Positive for diarrhea. Negative for abdominal pain, blood in stool, nausea and vomiting.  Neurological: Negative for dizziness.      Objective:    Physical Exam Vitals reviewed.  Constitutional:      Appearance: Normal appearance.  Cardiovascular:     Rate and Rhythm: Normal rate and regular rhythm.  Pulmonary:     Effort: Pulmonary effort is normal.     Comments: Slightly diminished breath sounds throughout. Abdominal:     General: Bowel sounds are normal.     Palpations: Abdomen is soft. There is no mass.     Tenderness: There is no abdominal tenderness. There is no guarding or rebound.  Musculoskeletal:     Right lower leg: No edema.     Left lower leg: No edema.  Neurological:     Mental Status: She is alert.     BP 134/88 (BP Location: Left Arm, Patient Position: Sitting, Cuff Size: Normal)    Pulse 96    Temp 98.3 F (36.8 C) (Temporal)    Wt 136 lb 12.8 oz (62.1 kg)    SpO2 98%    BMI 23.48 kg/m  Wt Readings from Last 3 Encounters:   10/25/19 136 lb 12.8 oz (62.1 kg)  10/09/17 143 lb 11.2 oz (65.2 kg)  10/17/16 155 lb 9.6 oz (70.6 kg)     Health Maintenance Due  Topic Date Due   Hepatitis C Screening  Never done   COVID-19 Vaccine (1) Never done   HIV Screening  Never done   TETANUS/TDAP  05/05/2004   MAMMOGRAM  08/11/2009   PAP SMEAR-Modifier  04/10/2015   COLONOSCOPY  08/12/2015    There  are no preventive care reminders to display for this patient.  Lab Results  Component Value Date   TSH 2.13 04/26/2019   Lab Results  Component Value Date   WBC 10.8 (H) 10/09/2017   HGB 15.5 (H) 10/09/2017   HCT 44.8 10/09/2017   MCV 98.5 10/09/2017   PLT 242.0 10/09/2017   Lab Results  Component Value Date   NA 138 10/09/2017   K 4.5 10/09/2017   CO2 29 10/09/2017   GLUCOSE 86 10/09/2017   BUN 6 10/09/2017   CREATININE 0.60 10/09/2017   BILITOT 0.5 05/30/2012   ALKPHOS 82 05/30/2012   AST 21 05/30/2012   ALT 18 05/30/2012   PROT 7.2 05/30/2012   ALBUMIN 4.0 05/30/2012   CALCIUM 9.5 10/09/2017   ANIONGAP 7 03/13/2015   GFR 107.72 10/09/2017   No results found for: CHOL No results found for: HDL No results found for: LDLCALC No results found for: TRIG No results found for: CHOLHDL No results found for: NIDP8E    Assessment & Plan:   #1 history of chronic insomnia. Patient requesting refills of amitriptyline. She has been on this for many years. We had discussions multiple times about risk of anticholinergic medications. She has tried unsuccessfully to taper off in the past -Refilled amitriptyline 25 mg nightly for 1 year -Continue low-dose alprazolam as needed insomnia  #2 frequent loose stools. 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 She is not any red flags such as bloody stools, loss of appetite, weight loss, etc.  -We discussed the fact that she really needs repeat colonoscopy. She is reluctant till she gets Medicare coverage. -We have suggested that she try to go gluten-free to see if  this makes a difference. We also suggest that she consider trial of FiberCon, Citrucel, or Metamucil to see if this helps with bulking up her stools  #3 hypothyroidism on replacement. -Continue levothyroxine. Will need repeat TSH within 6 months  Meds ordered this encounter  Medications   amitriptyline (ELAVIL) 25 MG tablet    Sig: Take 1 tablet (25 mg total) by mouth at bedtime. NEEDS ApPT FOR FURTHER REFILLS    Dispense:  90 tablet    Refill:  3    Follow-up: No follow-ups on file.    Evelena Peat, MD

## 2019-10-25 NOTE — Patient Instructions (Signed)
Gluten-Free Diet for Celiac Disease, Adult  The gluten-free diet includes all foods that do not contain gluten. Gluten is a protein that is found in wheat, rye, barley, and some other grains. Following the gluten-free diet is the only treatment for people with celiac disease. It helps to prevent damage to the intestines and improves or eliminates the symptoms of celiac disease. Following the gluten-free diet requires some planning. It can be challenging at first, but it gets easier with time and practice. There are more gluten-free options available today than ever before. If you need help finding gluten-free foods or if you have questions, talk with your diet and nutrition specialist (registered dietitian) or your health care provider. What do I need to know about a gluten-free diet?  All fruits, vegetables, and meats are safe to eat and do not contain gluten.  When grocery shopping, start by shopping in the produce, meat, and dairy sections. These sections are more likely to contain gluten-free foods. Then move to the aisles that contain packaged foods if you need to.  Read all food labels. Gluten is often added to foods. Always check the ingredient list and look for warnings, such as "may contain gluten."  Talk with your dietitian or health care provider before taking a gluten-free multivitamin or mineral supplement.  Be aware of gluten-free foods having contact with foods that contain gluten (cross-contamination). This can happen at home and with any processed foods. ? Talk with your health care provider or dietitian about how to reduce the risk of cross-contamination in your home. ? If you have questions about how a food is processed, ask the manufacturer. What key words help to identify gluten? Foods that list any of these key words on the label usually contain gluten:  Wheat, flour, enriched flour, bromated flour, white flour, durum flour, graham flour, phosphated flour, self-rising flour,  semolina, farina, barley (malt), rye, and oats.  Starch, dextrin, modified food starch, or cereal.  Thickening, fillers, or emulsifiers.  Malt flavoring, malt extract, or malt syrup.  Hydrolyzed vegetable protein. In the [place] packaged foods that are gluten-free are required to be labeled "GF." These foods should be easy to identify and are safe to eat. In the [place] food companies are also required to list common food allergens, including wheat, on their labels. Recommended foods Grains  Amaranth, bean flours, 100% buckwheat flour, corn, millet, nut flours or nut meals, GF oats, quinoa, rice, sorghum, teff, rice wafers, pure cornmeal tortillas, popcorn, and hot cereals made from cornmeal. Hominy, rice, wild rice. Some Asian rice noodles or bean noodles. Arrowroot starch, corn bran, corn flour, corn germ, cornmeal, corn starch, potato flour, potato starch flour, and rice bran. Plain, brown, and sweet rice flours. Rice polish, soy flour, and tapioca starch. Vegetables  All plain fresh, frozen, and canned vegetables. Fruits  All plain fresh, frozen, canned, and dried fruits, and 100% fruit juices. Meats and other protein foods  All fresh beef, pork, poultry, fish, seafood, and eggs. Fish canned in water, oil, brine, or vegetable broth. Plain nuts and seeds, peanut butter. Some lunch meat and some frankfurters. Dried beans, dried peas, and lentils. Dairy  Fresh plain, dry, evaporated, or condensed milk. Cream, butter, sour cream, whipping cream, and most yogurts. Unprocessed cheese, most processed cheeses, some cottage cheese, some cream cheeses. Beverages  Coffee, tea, most herbal teas. Carbonated beverages and some root beers. Wine, sake, and distilled spirits, such as gin, vodka, and whiskey. Most hard ciders. Fats and oils  Butter,   margarine, vegetable oil, hydrogenated butter, olive oil, shortening, lard, cream, and some mayonnaise. Some commercial salad dressings. Olives. Sweets and  desserts  Sugar, honey, some syrups, molasses, jelly, and jam. Plain hard candy, marshmallows, and gumdrops. Pure cocoa powder. Plain chocolate. Custard and some pudding mixes. Gelatin desserts, sorbets, frozen ice pops, and sherbet. Cake, cookies, and other desserts prepared with allowed flours. Some commercial ice creams. Cornstarch, tapioca, and rice puddings. Seasoning and other foods  Some canned or frozen soups. Monosodium glutamate (MSG). Cider, rice, and wine vinegar. Baking soda and baking powder. Cream of tartar. Baking and nutritional yeast. Certain soy sauces made without wheat (ask your dietitian about specific brands that are allowed). Nuts, coconut, and chocolate. Salt, pepper, herbs, spices, flavoring extracts, imitation or artificial flavorings, natural flavorings, and food colorings. Some medicines and supplements. Some lip glosses and other cosmetics. Rice syrups. The items listed may not be a complete list. Talk with your dietitian about what dietary choices are best for you. Foods to avoid Grains  Barley, bran, bulgur, couscous, cracked wheat, [place], farro, graham, malt, matzo, semolina, wheat germ, and all wheat and rye cereals including spelt and kamut. Cereals containing malt as a flavoring, such as rice cereal. Noodles, spaghetti, macaroni, most packaged rice mixes, and all mixes containing wheat, rye, barley, or triticale. Vegetables  Most creamed vegetables and most vegetables canned in sauces. Some commercially prepared vegetables and salads. Fruits  Thickened or prepared fruits and some pie fillings. Some fruit snacks and fruit roll-ups. Meats and other protein foods  Any meat or meat alternative containing wheat, rye, barley, or gluten stabilizers. These are often marinated or packaged meats and lunch meats. Bread-containing products, such as Swiss steak, croquettes, meatballs, and meatloaf. Most tuna canned in vegetable broth and turkey with hydrolyzed vegetable  protein (HVP) injected as part of the basting. Seitan. Imitation fish. Eggs in sauces made from ingredients to avoid. Dairy  Commercial chocolate milk drinks and malted milk. Some non-dairy creamers. Any cheese product containing ingredients to avoid. Beverages  Certain cereal beverages. Beer, ale, malted milk, and some root beers. Some hard ciders. Some instant flavored coffees. Some herbal teas made with barley or with barley malt added. Fats and oils  Some commercial salad dressings. Sour cream containing modified food starch. Sweets and desserts  Some toffees. Chocolate-coated nuts (may be rolled in wheat flour) and some commercial candies and candy bars. Most cakes, cookies, donuts, pastries, and other baked goods. Some commercial ice cream. Ice cream cones. Commercially prepared mixes for cakes, cookies, and other desserts. Bread pudding and other puddings thickened with flour. Products containing brown rice syrup made with barley malt enzyme. Desserts and sweets made with malt flavoring. Seasoning and other foods  Some curry powders, some dry seasoning mixes, some gravy extracts, some meat sauces, some ketchups, some prepared mustards, and horseradish. Certain soy sauces. Malt vinegar. Bouillon and bouillon cubes that contain HVP. Some chip dips, and some chewing gum. Yeast extract. Brewer's yeast. Caramel color. Some medicines and supplements. Some lip glosses and other cosmetics. The items listed may not be a complete list. Talk with your dietitian about what dietary choices are best for you. Summary  Gluten is a protein that is found in wheat, rye, barley, and some other grains. The gluten-free diet includes all foods that do not contain gluten.  If you need help finding gluten-free foods or if you have questions, talk with your diet and nutrition specialist (registered dietitian) or your health care provider.  Read   all food labels. Gluten is often added to foods. Always check the  ingredient list and look for warnings, such as "may contain gluten." This information is not intended to replace advice given to you by your health care provider. Make sure you discuss any questions you have with your health care provider. Document Revised: 04/03/2017 Document Reviewed: 02/04/2016 Elsevier Patient Education  2020 [employer].  We really need to get you in to see GI for repeat colonoscopy.

## 2019-11-21 ENCOUNTER — Telehealth: Payer: Self-pay | Admitting: Family Medicine

## 2019-11-21 DIAGNOSIS — Z1211 Encounter for screening for malignant neoplasm of colon: Secondary | ICD-10-CM

## 2019-11-21 NOTE — Telephone Encounter (Signed)
Pt stated she has had a stomach ache for two days and does not know if it is related to her needing a colonoscopy. She would like a referral to colonoscopy and wanted to remind her PCP that she does not have insurance      Pt can be reached at [phone]

## 2019-11-21 NOTE — Telephone Encounter (Signed)
Okay to set up GI referral for colonoscopy.  She is overdue.  I do not know if patient has a preference of groups

## 2019-11-22 ENCOUNTER — Encounter: Payer: Self-pay | Admitting: Gastroenterology

## 2019-11-22 NOTE — Telephone Encounter (Signed)
Spoke with the pt and informed her of the message below.  Patient stated she recalls the last GI doctor was Dr Randa Evens and he may have retired.  Patient is aware the referral was placed for [facility] and someone will call with appt info.

## 2019-12-19 ENCOUNTER — Other Ambulatory Visit: Payer: Self-pay | Admitting: Family Medicine

## 2020-01-23 ENCOUNTER — Encounter: Payer: Self-pay | Admitting: Gastroenterology

## 2020-03-15 ENCOUNTER — Telehealth (INDEPENDENT_AMBULATORY_CARE_PROVIDER_SITE_OTHER): Payer: BLUE CROSS/BLUE SHIELD | Admitting: Internal Medicine

## 2020-03-15 ENCOUNTER — Emergency Department (HOSPITAL_COMMUNITY): Payer: BLUE CROSS/BLUE SHIELD

## 2020-03-15 ENCOUNTER — Other Ambulatory Visit: Payer: Self-pay | Admitting: Emergency Medicine

## 2020-03-15 ENCOUNTER — Encounter: Payer: Self-pay | Admitting: Internal Medicine

## 2020-03-15 ENCOUNTER — Other Ambulatory Visit: Payer: Self-pay

## 2020-03-15 ENCOUNTER — Emergency Department (HOSPITAL_COMMUNITY)
Admission: EM | Admit: 2020-03-15 | Discharge: 2020-03-15 | Disposition: A | Discharge status: Home / Self Care | Payer: BLUE CROSS/BLUE SHIELD | Attending: Emergency Medicine | Admitting: Emergency Medicine

## 2020-03-15 ENCOUNTER — Encounter (HOSPITAL_COMMUNITY): Payer: Self-pay | Admitting: Emergency Medicine

## 2020-03-15 DIAGNOSIS — R079 Chest pain, unspecified: Secondary | ICD-10-CM

## 2020-03-15 DIAGNOSIS — R450 Nervousness: Secondary | ICD-10-CM

## 2020-03-15 DIAGNOSIS — R0789 Other chest pain: Secondary | ICD-10-CM | POA: Insufficient documentation

## 2020-03-15 DIAGNOSIS — I10 Essential (primary) hypertension: Secondary | ICD-10-CM | POA: Diagnosis not present

## 2020-03-15 DIAGNOSIS — F17210 Nicotine dependence, cigarettes, uncomplicated: Secondary | ICD-10-CM | POA: Diagnosis not present

## 2020-03-15 DIAGNOSIS — E039 Hypothyroidism, unspecified: Secondary | ICD-10-CM | POA: Insufficient documentation

## 2020-03-15 DIAGNOSIS — E038 Other specified hypothyroidism: Secondary | ICD-10-CM

## 2020-03-15 DIAGNOSIS — F17200 Nicotine dependence, unspecified, uncomplicated: Secondary | ICD-10-CM | POA: Diagnosis not present

## 2020-03-15 DIAGNOSIS — R109 Unspecified abdominal pain: Secondary | ICD-10-CM | POA: Diagnosis not present

## 2020-03-15 DIAGNOSIS — Z79899 Other long term (current) drug therapy: Secondary | ICD-10-CM | POA: Diagnosis not present

## 2020-03-15 LAB — HEPATIC FUNCTION PANEL
ALT: 20 U/L (ref 0–44)
AST: 25 U/L (ref 15–41)
Albumin: 4.6 g/dL (ref 3.5–5.0)
Alkaline Phosphatase: 64 U/L (ref 38–126)
Bilirubin, Direct: 0.1 mg/dL (ref 0.0–0.2)
Indirect Bilirubin: 0.5 mg/dL (ref 0.3–0.9)
Total Bilirubin: 0.6 mg/dL (ref 0.3–1.2)
Total Protein: 7.6 g/dL (ref 6.5–8.1)

## 2020-03-15 LAB — CBC
HCT: 45.1 % (ref 36.0–46.0)
Hemoglobin: 15.7 g/dL — ABNORMAL HIGH (ref 12.0–15.0)
MCH: 34.1 pg — ABNORMAL HIGH (ref 26.0–34.0)
MCHC: 34.8 g/dL (ref 30.0–36.0)
MCV: 97.8 fL (ref 80.0–100.0)
Platelets: 244 10*3/uL (ref 150–400)
RBC: 4.61 MIL/uL (ref 3.87–5.11)
RDW: 13.2 % (ref 11.5–15.5)
WBC: 8.2 10*3/uL (ref 4.0–10.5)
nRBC: 0 % (ref 0.0–0.2)

## 2020-03-15 LAB — BASIC METABOLIC PANEL
Anion gap: 10 (ref 5–15)
BUN: 9 mg/dL (ref 8–23)
CO2: 24 mmol/L (ref 22–32)
Calcium: 9.8 mg/dL (ref 8.9–10.3)
Chloride: 104 mmol/L (ref 98–111)
Creatinine, Ser: 0.48 mg/dL (ref 0.44–1.00)
GFR, Estimated: >60 mL/min (ref >60)
Glucose, Bld: 98 mg/dL (ref 70–99)
Potassium: 4 mmol/L (ref 3.5–5.1)
Sodium: 138 mmol/L (ref 135–145)

## 2020-03-15 LAB — LIPASE, BLOOD: Lipase: 35 U/L (ref 11–51)

## 2020-03-15 LAB — TSH: TSH: 5.096 u[IU]/mL — ABNORMAL HIGH (ref 0.350–4.500)

## 2020-03-15 LAB — TROPONIN I (HIGH SENSITIVITY): Troponin I (High Sensitivity): <2 ng/L (ref <18)

## 2020-03-15 MED ORDER — IOHEXOL 300 MG/ML  SOLN
100.0000 mL | Freq: Once | INTRAMUSCULAR | Status: AC | PRN
  Administered 2020-03-15: 100 mL via INTRAVENOUS

## 2020-03-15 MED ORDER — SODIUM CHLORIDE (PF) 0.9 % IJ SOLN
INTRAMUSCULAR | Status: AC
  Filled 2020-03-15: qty 50

## 2020-03-15 MED ORDER — NICOTINE 21 MG/24HR TD PT24
21.0000 mg | MEDICATED_PATCH | Freq: Once | TRANSDERMAL | Status: DC
  Filled 2020-03-15: qty 1

## 2020-03-15 NOTE — ED Notes (Signed)
Patient off floor for scan 

## 2020-03-15 NOTE — Discharge Instructions (Addendum)
There is some inflammation in part of your colon.  Follow-up with your PCP or gastroenterology for this.  It sounds if you have had an inflammatory bowel disease in the past and this could have something to do with that.  I am hesitant to start much treatment right now since overall her diarrhea is improved and your abdominal pain has been improving.

## 2020-03-15 NOTE — ED Provider Notes (Signed)
[facility]-EMERGENCY DEPT Provider Note   CSN: [id number] Arrival date & time: 03/15/20  1047     History Chief Complaint  Patient presents with   Chest Pain   Abdominal Pain    Colleen Owen is a 64 y.o. female.  HPI Patient presents with multiple complaints.  Sent in by PCP because of her chest pain however.  States that she has been tremulous for several days now.  States she gets shaky particular in the morning after she takes her thyroid medicine.  Does improve somewhat throughout the day.  Although today is started having anterior chest pain.  Comes and goes.  Not exertional.  States she does feel her heart race with the episodes.  Then the withdrawal wants own after few minutes.  Does have some chest tightness with it. Also has had about 4 days of dull headache.  Dull in the front of her head.  Comes and goes.  Not associated with numbness weakness or vision changes. Also has had abdominal pain for a while now.  In the mid upper abdomen.  Dull.  States she recently had an improvement of her diarrhea.  States she had diarrhea for a year or 2.  States she improved by changing her diet. Also had an episode where she had difficulty moving her left foot.  That was a few days ago.  Last around 10 minutes.  Also resolved on its own.  It is back to normal.  States the foot just was not working.  No tingling in the foot at the time. Patient also states she has lost weight.  States she is not been trying.  That is been going on for about a year. Patient has thyroid issues.  Also is worried she could have cancer.  She is a smoker.    Past Medical History:  Diagnosis Date   Acute bronchitis 02/20/2009   ANXIETY 09/08/2008   DEPRESSION 09/08/2008   ELEVATED BLOOD PRESSURE 07/16/2009   HYPERTENSION 09/08/2008   HYPOTHYROIDISM 09/08/2008    Patient Active Problem List   Diagnosis Date Noted   Frequent loose stools 10/25/2019   Chronic insomnia 08/18/2014    COUGH 04/10/2010   SKIN TAG 10/17/2009   MALAISE 10/17/2009   ELEVATED BLOOD PRESSURE 07/16/2009   ACUTE BRONCHITIS 02/20/2009   Hypothyroidism 09/08/2008   Anxiety state 09/08/2008   DEPRESSION 09/08/2008   HYPERTENSION 09/08/2008    Past Surgical History:  Procedure Laterality Date   URETHRA SURGERY  2008     OB History   No obstetric history on file.     Family History  Problem Relation Age of Onset   Cancer Mother        cancer   Hypertension Mother    Diabetes Father    Stroke Father    Alcohol abuse Maternal Grandfather     Social History   Tobacco Use   Smoking status: Current Every Day Smoker    Packs/day: 1.50    Years: 45.00    Pack years: 67.50    Types: Cigarettes   Smokeless tobacco: Never Used  Substance Use Topics   Alcohol use: Yes    Alcohol/week: 7.0 standard drinks    Types: 7 Standard drinks or equivalent per week   Drug use: No    Home Medications Prior to Admission medications   Medication Sig Start Date End Date Taking? Authorizing Provider  ALPRAZolam Prudy Feeler) 0.5 MG tablet Take 1 tablet by mouth twice daily as needed for  anxiety Patient taking differently: Take 0.25 mg by mouth in the morning and at bedtime.  12/20/19  Yes Burchette, Elberta Fortis, MD  amitriptyline (ELAVIL) 25 MG tablet Take 1 tablet (25 mg total) by mouth at bedtime. NEEDS ApPT FOR FURTHER REFILLS Patient taking differently: Take 25 mg by mouth at bedtime.  10/25/19  Yes Burchette, Elberta Fortis, MD  levothyroxine (SYNTHROID) 112 MCG tablet Take 1 tablet (112 mcg total) by mouth daily. 04/26/19  Yes Burchette, Elberta Fortis, MD  Multiple Vitamin (MULTIVITAMIN) tablet Take 1 tablet by mouth daily.   Yes [provider]    Allergies    Codeine sulfate and Loratadine-pseudoephedrine er  Review of Systems   Review of Systems  Constitutional: Positive for diaphoresis and unexpected weight change.  Respiratory: Negative for shortness of breath.     Cardiovascular: Positive for chest pain and palpitations.  Gastrointestinal: Positive for abdominal pain.  Musculoskeletal: Negative for back pain.  Skin: Negative for rash.  Neurological: Positive for numbness and headaches.  Psychiatric/Behavioral: Negative for confusion.    Physical Exam Updated Vital Signs BP (!) 159/101    Pulse 91    Temp 98.3 F (36.8 C) (Oral)    Resp (!) 21    Ht 5\' 5"  (1.651 m)    Wt 56.7 kg    SpO2 97%    BMI 20.80 kg/m   Physical Exam Vitals and nursing note reviewed.  HENT:     Head: Normocephalic and atraumatic.  Cardiovascular:     Rate and Rhythm: Normal rate and regular rhythm.  Pulmonary:     Effort: No tachypnea.     Breath sounds: No wheezing, rhonchi or rales.  Chest:     Chest wall: No tenderness.  Abdominal:     Comments: Tenderness in upper abdomen.  May have some hepatomegaly.  Musculoskeletal:     Right lower leg: No edema.     Left lower leg: No edema.  Skin:    General: Skin is warm.     Capillary Refill: Capillary refill takes less than 2 seconds.  Neurological:     Mental Status: She is alert and oriented to person, place, and time.     ED Results / Procedures / Treatments   Labs (all labs ordered are listed, but only abnormal results are displayed) Labs Reviewed  CBC - Abnormal; Notable for the following components:      Result Value   Hemoglobin 15.7 (*)    MCH 34.1 (*)    All other components within normal limits  TSH - Abnormal; Notable for the following components:   TSH 5.096 (*)    All other components within normal limits  BASIC METABOLIC PANEL  HEPATIC FUNCTION PANEL  LIPASE, BLOOD  TROPONIN I (HIGH SENSITIVITY)    EKG EKG Interpretation  Date/Time:  Thursday March 15 2020 11:00:24 EST Ventricular Rate:  96 PR Interval:    QRS Duration: 84 QT Interval:  342 QTC Calculation: 433 R Axis:   75 Text Interpretation: Sinus rhythm Borderline short PR interval Prominent P waves, nondiagnostic 12  Lead; Mason-Likar Confirmed by 11-28-2004 [phone]) on 03/15/2020 12:02:12 PM   Radiology DG Chest 2 View  Result Date: 03/15/2020 CLINICAL DATA:  Chest pain. EXAM: CHEST - 2 VIEW COMPARISON:  May 30, 2012. FINDINGS: The heart size and mediastinal contours are within normal limits. Both lungs are clear. No pneumothorax or pleural effusion is noted. Hyperexpansion of the lungs is noted. The visualized skeletal structures are unremarkable. IMPRESSION: No  active cardiopulmonary disease. Electronically Signed   By: Lupita Raider M.D.   On: 03/15/2020 12:21   CT Head Wo Contrast  Result Date: 03/15/2020 CLINICAL DATA:  Headache, new or worsening, neuro deficit. Additional history provided: Patient reports headaches for 1 week, left foot numbness, dizziness. EXAM: CT HEAD WITHOUT CONTRAST TECHNIQUE: Contiguous axial images were obtained from the base of the skull through the vertex without intravenous contrast. COMPARISON:  No pertinent prior exams are available for comparison. FINDINGS: Brain: Cerebral volume is normal for age. 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 There is no acute intracranial hemorrhage. No demarcated cortical infarct. No extra-axial fluid collection. No evidence of intracranial mass. No midline shift. Vascular: No hyperdense vessel.  Atherosclerotic calcifications. Skull: Normal. Negative for fracture or focal lesion. Sinuses/Orbits: Visualized orbits show no acute finding. Paranasal sinus mucosal thickening. Most notably, there is moderate ethmoid sinus mucosal thickening at the imaged levels. IMPRESSION: No evidence of acute intracranial abnormality. 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 Paranasal sinus mucosal thickening, most notably ethmoidal. Electronically Signed   By: Jackey Loge DO   On: 03/15/2020 14:34   CT Chest W Contrast  Result Date: 03/15/2020 CLINICAL DATA:  Acute chest and abdominal pain.  Weight loss. EXAM:  CT CHEST, ABDOMEN, AND PELVIS WITH CONTRAST TECHNIQUE: Multidetector CT imaging of the chest, abdomen and pelvis was performed following the standard protocol during bolus administration of intravenous contrast. CONTRAST:  OMNIPAQUE IOHEXOL 300 MG/ML  SOLN COMPARISON:  December 26, 2016.  February 13, 2006. FINDINGS: CT CHEST FINDINGS Cardiovascular: There is no evidence of thoracic aortic dissection or aneurysm. Normal cardiac size. No pericardial effusion. 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 Mediastinum/Nodes: No enlarged mediastinal, hilar, or axillary lymph nodes. Thyroid gland, trachea, and esophagus demonstrate no significant findings. Lungs/Pleura: No pneumothorax or pleural effusion is noted. Emphysematous disease is noted throughout both lungs. No acute pulmonary disease is noted. Musculoskeletal: No chest wall mass or suspicious bone lesions identified. CT ABDOMEN PELVIS FINDINGS Hepatobiliary: No focal liver abnormality is seen. No gallstones, gallbladder wall thickening, or biliary dilatation. Pancreas: Unremarkable. No pancreatic ductal dilatation or surrounding inflammatory changes. Spleen: Normal in size without focal abnormality. Adrenals/Urinary Tract: Adrenal glands are unremarkable. Kidneys are normal, without renal calculi, focal lesion, or hydronephrosis. Bladder is unremarkable. Stomach/Bowel: Stomach appears normal. There is no evidence of bowel obstruction. The appendix appears normal. Mild wall thickening of the cecum is noted which may represent focal infectious or inflammatory colitis or typhlitis. Stool is noted throughout the colon. Vascular/Lymphatic: No significant vascular findings are present. No enlarged abdominal or pelvic lymph nodes. Reproductive: Uterus and bilateral adnexa are unremarkable. Other: No abdominal wall hernia or abnormality. No abdominopelvic ascites. Musculoskeletal: No  acute or significant osseous findings. IMPRESSION: 1. 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 2. Emphysematous disease is noted throughout both lungs. 3. Mild wall thickening of the cecum is noted which may represent focal infectious or inflammatory colitis or typhlitis. 4. No other abnormality seen in the chest, abdomen or pelvis. 5. Emphysema. Emphysema (ICD10-J43.9). Electronically Signed   By: Lupita Raider M.D.   On: 03/15/2020 14:45   CT ABDOMEN PELVIS W CONTRAST  Result Date: 03/15/2020 CLINICAL DATA:  Acute chest and abdominal pain.  Weight loss. EXAM: CT CHEST, ABDOMEN, AND PELVIS WITH CONTRAST TECHNIQUE: Multidetector CT imaging  of the chest, abdomen and pelvis was performed following the standard protocol during bolus administration of intravenous contrast. CONTRAST:  OMNIPAQUE IOHEXOL 300 MG/ML  SOLN COMPARISON:  December 26, 2016.  February 13, 2006. FINDINGS: CT CHEST FINDINGS Cardiovascular: There is no evidence of thoracic aortic dissection or aneurysm. Normal cardiac size. No pericardial effusion. 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 Mediastinum/Nodes: No enlarged mediastinal, hilar, or axillary lymph nodes. Thyroid gland, trachea, and esophagus demonstrate no significant findings. Lungs/Pleura: No pneumothorax or pleural effusion is noted. Emphysematous disease is noted throughout both lungs. No acute pulmonary disease is noted. Musculoskeletal: No chest wall mass or suspicious bone lesions identified. CT ABDOMEN PELVIS FINDINGS Hepatobiliary: No focal liver abnormality is seen. No gallstones, gallbladder wall thickening, or biliary dilatation. Pancreas: Unremarkable. No pancreatic ductal dilatation or surrounding inflammatory changes. Spleen: Normal in size without focal abnormality. Adrenals/Urinary Tract:  Adrenal glands are unremarkable. Kidneys are normal, without renal calculi, focal lesion, or hydronephrosis. Bladder is unremarkable. Stomach/Bowel: Stomach appears normal. There is no evidence of bowel obstruction. The appendix appears normal. Mild wall thickening of the cecum is noted which may represent focal infectious or inflammatory colitis or typhlitis. Stool is noted throughout the colon. Vascular/Lymphatic: No significant vascular findings are present. No enlarged abdominal or pelvic lymph nodes. Reproductive: Uterus and bilateral adnexa are unremarkable. Other: No abdominal wall hernia or abnormality. No abdominopelvic ascites. Musculoskeletal: No acute or significant osseous findings. IMPRESSION: 1. 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 2. Emphysematous disease is noted throughout both lungs. 3. Mild wall thickening of the cecum is noted which may represent focal infectious or inflammatory colitis or typhlitis. 4. No other abnormality seen in the chest, abdomen or pelvis. 5. Emphysema. Emphysema (ICD10-J43.9). Electronically Signed   By: Lupita Raider M.D.   On: 03/15/2020 14:45    Procedures Procedures (including critical care time)  Medications Ordered in ED Medications  nicotine (NICODERM CQ - dosed in mg/24 hours) patch 21 mg (0 mg Transdermal Hold 03/15/20 1452)  sodium chloride (PF) 0.9 % injection (has no administration in time range)  iohexol (OMNIPAQUE) 300 MG/ML solution 100 mL (100 mLs Intravenous Contrast Given 03/15/20 1359)    ED Course  I have reviewed the triage vital signs and the nursing notes.  Pertinent labs & imaging results that were available during my care of the patient were reviewed by me and considered in my medical decision making (see chart for details).    MDM Rules/Calculators/A&P                          Patient presents with several different complaints.   Tremor chest pain abdominal pain.  Has had diarrhea for around a year that is since improved.  CT chest reassuring.  EKG reassuring.  Doubt cardiac cause of pain.  Not exertional.  Troponin negative.  Doubt pulmonary embolism.  CT scan done due to heavy smoking history.  No mass lesion seen.  Also has had upper abdominal pain.  And also unexplained weight loss.  Does have some right lower quadrant inflammation.  I think more likely an inflammatory bowel disease.  Reportedly had history of same.  Since her diarrhea is improved and the pain is not in this location I am hesitant to start treatment.  Think she needs GI follow-up or at  least PCP follow-up if she cannot arrange GI follow-up.  TSH mildly elevated which go against hyperthyroidism as a cause of the tremors.  Hemoglobin is up but this appears chronic for her and likely due to her chronic smoking.  Will discharge home with outpatient follow-up. I reviewed imaging EKG and blood work.  Also past history is reviewed. Final Clinical Impression(s) / ED Diagnoses Final diagnoses:  Abdominal pain, unspecified abdominal location  Nonspecific chest pain    Rx / DC Orders ED Discharge Orders    None       Benjiman CorePickering, Fayola Meckes, MD 03/15/20 1525

## 2020-03-15 NOTE — ED Notes (Signed)
Lab will add on lab work

## 2020-03-15 NOTE — Progress Notes (Signed)
Virtual Visit via Telephone Note  I connected with@ on 03/15/20 at  9:00 AM EST by telephone and verified that I am speaking with the correct person using two identifiers.   I discussed the limitations, risks, security and privacy concerns of performing an evaluation and management service by telephone and the limited availability of in person appointments. tThere may be a patient responsible charge related to this service. The patient expressed understanding and agreed to proceed.  Location patient: home Location provider: home office Participants present for the call: patient, provider Patient did not have a visit in the prior 7 days to address this/these issue(s).   History of Present Illness: Colleen Owen  Presents SDA  appt  PCP appt NA Presents because of a new concern developed over the last week of shakiness and jitteriness numbness that can last most of the day about an hour after she gets up in the morning.  She is on thyroid medicine that has not been changed in the last 6 months dosing wise and does not miss doses. She has had longstanding anxiety and is been on alprazolam twice daily for a long time without missed doses or new meds.  She also takes amitriptyline at night which helps for sleep. This morning she had the onset of left-sided chest pain and pressure that comes and goes non positional and it is scaring her she has not had this before. There has been some associated nausea and sweating but no vomiting no specific increase in shortness of breath. When she walked her dog today she stated it was hard to get up the hill but no specific changes She has ongoing smoker's cough for heavy smoking for a number of years at his baseline. No significant caffeine or changes with this has about 1 alcoholic beverage a day. No diagnosed heart disease and hypertension.  She lives alone although her son lives 6 miles away.    No history of exposures fever.  She has had diarrhea ""  for a year but it is resolved recently when she changed her diet.  And water. Observations/Objective: Patient sounds personable and well but anxious on the phone. I do not appreciate any SOB. Speech and thought processing are grossly intact. Patient reported vitals: no fever  Lab Results  Component Value Date   WBC 10.8 (H) 10/09/2017   HGB 15.5 (H) 10/09/2017   HCT 44.8 10/09/2017   PLT 242.0 10/09/2017   GLUCOSE 86 10/09/2017   ALT 18 05/30/2012   AST 21 05/30/2012   NA 138 10/09/2017   K 4.5 10/09/2017   CL 100 10/09/2017   CREATININE 0.60 10/09/2017   BUN 6 10/09/2017   CO2 29 10/09/2017   TSH 2.13 04/26/2019    Assessment and Plan: Left-sided chest pain  Jittery  Other specified hypothyroidism  Smoker   New onset chest pain with underlying risk although uncertain cause New onset jitteriness in the mornings which could be related to thyroid medicine and dosing.  As it seems to be an hour after she gets up and she takes her thyroid medicine first thing in the morning. Advise she have updated lab tests however because of her new onset chest pain there is concern for cardiac cause. Advised she seek care emergency d  with understanding she would rather not go but I cannot reassure her that this is not cardiac in nature. She can take an extra alprazolam to see what her symptoms do but if persist should get evaluated  urgently.  At some point then follow-up with Dr. Caryl Never  with updated labs and thyroid evaluation. Follow Up Instructions:  Can take an extra alprazolam and  Get Ed evaluation for  New onset chest pain .  I did not refer this patient for an OV in the next 24 hours for this/these issue(s).  I discussed the assessment and treatment plan with the patient. The patient was provided an opportunity to ask questions and answered. The patient agreed with the plan and demonstrated an understanding of the instructions.   The patient was advised to call back or seek  an in-person evaluation if the symptoms worsen or if the condition fails to improve as anticipated.  I provided of non-face-to-face time during this encounter. Return for To Ed  and fu dr B .  Berniece Andreas, MD

## 2020-03-15 NOTE — ED Triage Notes (Signed)
Pt reports central left sided chest pains that started today when woke up and now having abd pains. Yesterday while at work had left foot numbness that last a short period. Having headache x 4 days.

## 2020-03-16 ENCOUNTER — Other Ambulatory Visit: Payer: Self-pay | Admitting: Family Medicine

## 2020-03-23 ENCOUNTER — Telehealth (INDEPENDENT_AMBULATORY_CARE_PROVIDER_SITE_OTHER): Payer: BLUE CROSS/BLUE SHIELD | Admitting: Family Medicine

## 2020-03-23 DIAGNOSIS — R0789 Other chest pain: Secondary | ICD-10-CM | POA: Diagnosis not present

## 2020-03-23 DIAGNOSIS — R197 Diarrhea, unspecified: Secondary | ICD-10-CM

## 2020-03-23 NOTE — Progress Notes (Signed)
Patient ID: Colleen Owen, female   DOB: 1955-11-12, 64 y.o.   MRN: [id number]  This visit type was conducted due to national recommendations for restrictions regarding the COVID-19 pandemic in an effort to limit this patient's exposure and mitigate transmission in our community.   Virtual Visit via Video Note  I connected with Any Mcneice on 03/23/20 at  3:15 PM EST by a video enabled telemedicine application and verified that I am speaking with the correct person using two identifiers.  Location patient: home Location provider:work or home office Persons participating in the virtual visit: patient, provider  I discussed the limitations of evaluation and management by telemedicine and the availability of in person appointments. The patient expressed understanding and agreed to proceed.   HPI: Novella has history of hypertension, hypothyroidism, chronic insomnia, ongoing nicotine use.  Recently had virtual visit with atypical chest pain and some increased nervousness and anxiousness.  She went to the ER for further evaluation on the 11th.  Ruled out for MI.  EKG no acute findings.  She had reported feeling "shaky "after taking her thyroid medication.  No recent exertional chest pain.  She had TSH of 5 in the ER.  ER notes, labs, EKG reviewed.    She had reported in the ER intermittent diarrhea for the past year though her symptoms are currently resolved.  She had declined previous GI referral because of lack of insurance.  She has no diarrhea at this time.  She has not had colonoscopy.  She states she has had some weight loss during the past year.  Prior exercise tolerance test 2014 unremarkable.  Multiple labs done in the ER including lipase, hepatic, TSH, troponin, CBC, basic metabolic panel these were all basically normal with exception of TSH of 5 as above.  When she was on dose of levothyroxine 125 mcg daily she had over replaced thyroid  She ended up having CT chest as well as abdomen and pelvis  and CT the head.  No lung mass.  She did have some emphysema changes on chest imaging.  There was comment of "mild wall thickening of the cecum which may represent focal infectious or inflammatory colitis".  No other acute findings.  No evidence for any cancer.  There was discussion of possible antibiotic use but they ended up not putting her on anything.  She denies any abdominal pain at this time.   ROS: See pertinent positives and negatives per HPI.  Past Medical History:  Diagnosis Date  . Acute bronchitis 02/20/2009  . ANXIETY 09/08/2008  . DEPRESSION 09/08/2008  . ELEVATED BLOOD PRESSURE 07/16/2009  . HYPERTENSION 09/08/2008  . HYPOTHYROIDISM 09/08/2008    Past Surgical History:  Procedure Laterality Date  . URETHRA SURGERY  2008    Family History  Problem Relation Age of Onset  . Cancer Mother        cancer  . Hypertension Mother   . Diabetes Father   . Stroke Father   . Alcohol abuse Maternal Grandfather     SOCIAL HX: Ongoing nicotine use   Current Outpatient Medications:  .  ALPRAZolam (XANAX) 0.5 MG tablet, Take 1 tablet by mouth twice daily as needed for anxiety, Disp: 90 tablet, Rfl: 0 .  amitriptyline (ELAVIL) 25 MG tablet, Take 1 tablet (25 mg total) by mouth at bedtime. NEEDS ApPT FOR FURTHER REFILLS (Patient taking differently: Take 25 mg by mouth at bedtime. ), Disp: 90 tablet, Rfl: 3 .  levothyroxine (SYNTHROID) 112 MCG tablet, Take 1  tablet by mouth once daily, Disp: 90 tablet, Rfl: 1 .  Multiple Vitamin (MULTIVITAMIN) tablet, Take 1 tablet by mouth daily., Disp: , Rfl:   EXAM:  VITALS per patient if applicable:  GENERAL: alert, oriented, appears well and in no acute distress  HEENT: atraumatic, conjunttiva clear, no obvious abnormalities on inspection of external nose and ears  NECK: normal movements of the head and neck  LUNGS: on inspection no signs of respiratory distress, breathing rate appears normal, no obvious gross SOB, gasping or wheezing  CV:  no obvious cyanosis  MS: moves all visible extremities without noticeable abnormality  PSYCH/NEURO: pleasant and cooperative, no obvious depression or anxiety, speech and thought processing grossly intact  ASSESSMENT AND PLAN:  Discussed the following assessment and plan:  #1 recent atypical chest pain.  CT chest and EKG unremarkable.  She does have fairly high risk of CAD but has not had any recent suggestive symptoms.  No recent exertional symptoms.  -We discussed possible further cardiac work-up but at this point she declines.  She is waiting for Medicare coverage.  She knows to follow-up promptly for any exertional symptoms or any progressive symptoms  #2 history of intermittent diarrhea in the past. -She does need to see GI but she declines referral at this time until she gets insurance.  We have advised if she has persistent diarrhea again not to wait     I discussed the assessment and treatment plan with the patient. The patient was provided an opportunity to ask questions and all were answered. The patient agreed with the plan and demonstrated an understanding of the instructions.   The patient was advised to call back or seek an in-person evaluation if the symptoms worsen or if the condition fails to improve as anticipated.     Evelena Peat, MD

## 2020-03-27 ENCOUNTER — Ambulatory Visit: Payer: BLUE CROSS/BLUE SHIELD | Admitting: Family Medicine

## 2020-03-27 ENCOUNTER — Emergency Department (HOSPITAL_COMMUNITY): Payer: BLUE CROSS/BLUE SHIELD

## 2020-03-27 ENCOUNTER — Other Ambulatory Visit: Payer: Self-pay

## 2020-03-27 ENCOUNTER — Observation Stay (HOSPITAL_COMMUNITY)
Admission: EM | Admit: 2020-03-27 | Discharge: 2020-03-28 | Disposition: A | Discharge status: Home / Self Care | Payer: BLUE CROSS/BLUE SHIELD | Attending: Family Medicine | Admitting: Family Medicine

## 2020-03-27 ENCOUNTER — Encounter (HOSPITAL_COMMUNITY): Payer: Self-pay

## 2020-03-27 DIAGNOSIS — Z20822 Contact with and (suspected) exposure to covid-19: Secondary | ICD-10-CM | POA: Insufficient documentation

## 2020-03-27 DIAGNOSIS — I10 Essential (primary) hypertension: Secondary | ICD-10-CM | POA: Insufficient documentation

## 2020-03-27 DIAGNOSIS — A0472 Enterocolitis due to Clostridium difficile, not specified as recurrent: Secondary | ICD-10-CM | POA: Diagnosis not present

## 2020-03-27 DIAGNOSIS — E890 Postprocedural hypothyroidism: Secondary | ICD-10-CM | POA: Insufficient documentation

## 2020-03-27 DIAGNOSIS — R197 Diarrhea, unspecified: Secondary | ICD-10-CM | POA: Diagnosis present

## 2020-03-27 DIAGNOSIS — F17210 Nicotine dependence, cigarettes, uncomplicated: Secondary | ICD-10-CM | POA: Insufficient documentation

## 2020-03-27 DIAGNOSIS — A498 Other bacterial infections of unspecified site: Secondary | ICD-10-CM

## 2020-03-27 LAB — RESP PANEL BY RT-PCR (FLU A&B, COVID) ARPGX2
Influenza A by PCR: NEGATIVE
Influenza B by PCR: NEGATIVE
SARS Coronavirus 2 by RT PCR: NEGATIVE

## 2020-03-27 LAB — COMPREHENSIVE METABOLIC PANEL
ALT: 15 U/L (ref 0–44)
AST: 34 U/L (ref 15–41)
Albumin: 5 g/dL (ref 3.5–5.0)
Alkaline Phosphatase: 65 U/L (ref 38–126)
Anion gap: 9 (ref 5–15)
BUN: <5 mg/dL — ABNORMAL LOW (ref 8–23)
CO2: 28 mmol/L (ref 22–32)
Calcium: 9.1 mg/dL (ref 8.9–10.3)
Chloride: 103 mmol/L (ref 98–111)
Creatinine, Ser: 0.64 mg/dL (ref 0.44–1.00)
GFR, Estimated: >60 mL/min (ref >60)
Glucose, Bld: 92 mg/dL (ref 70–99)
Potassium: 4 mmol/L (ref 3.5–5.1)
Sodium: 140 mmol/L (ref 135–145)
Total Bilirubin: 1 mg/dL (ref 0.3–1.2)
Total Protein: 7.7 g/dL (ref 6.5–8.1)

## 2020-03-27 LAB — URINALYSIS, ROUTINE W REFLEX MICROSCOPIC
Bilirubin Urine: NEGATIVE
Glucose, UA: NEGATIVE mg/dL
Hgb urine dipstick: NEGATIVE
Ketones, ur: 5 mg/dL — AB
Nitrite: NEGATIVE
Protein, ur: NEGATIVE mg/dL
Specific Gravity, Urine: 1.003 — ABNORMAL LOW (ref 1.005–1.030)
pH: 6 (ref 5.0–8.0)

## 2020-03-27 LAB — CBC
HCT: 45.5 % (ref 36.0–46.0)
Hemoglobin: 15.4 g/dL — ABNORMAL HIGH (ref 12.0–15.0)
MCH: 33.7 pg (ref 26.0–34.0)
MCHC: 33.8 g/dL (ref 30.0–36.0)
MCV: 99.6 fL (ref 80.0–100.0)
Platelets: 272 10*3/uL (ref 150–400)
RBC: 4.57 MIL/uL (ref 3.87–5.11)
RDW: 13.3 % (ref 11.5–15.5)
WBC: 13.3 10*3/uL — ABNORMAL HIGH (ref 4.0–10.5)
nRBC: 0 % (ref 0.0–0.2)

## 2020-03-27 LAB — C DIFFICILE QUICK SCREEN W PCR REFLEX
C Diff antigen: POSITIVE — AB
C Diff toxin: NEGATIVE

## 2020-03-27 LAB — CLOSTRIDIUM DIFFICILE BY PCR, REFLEXED: Toxigenic C. Difficile by PCR: POSITIVE — AB

## 2020-03-27 LAB — LIPASE, BLOOD: Lipase: 30 U/L (ref 11–51)

## 2020-03-27 MED ORDER — MORPHINE SULFATE (PF) 4 MG/ML IV SOLN
4.0000 mg | Freq: Once | INTRAVENOUS | Status: AC
  Administered 2020-03-27: 4 mg via INTRAVENOUS
  Filled 2020-03-27: qty 1

## 2020-03-27 MED ORDER — ONDANSETRON HCL 4 MG/2ML IJ SOLN: 4.0000 mg | Freq: Four times a day (QID) | INTRAMUSCULAR | Status: DC | PRN

## 2020-03-27 MED ORDER — NICOTINE 21 MG/24HR TD PT24
21.0000 mg | MEDICATED_PATCH | Freq: Every day | TRANSDERMAL | Status: DC
  Administered 2020-03-27 – 2020-03-28 (×2): 21 mg via TRANSDERMAL
  Filled 2020-03-27 (×2): qty 1

## 2020-03-27 MED ORDER — ADULT MULTIVITAMIN W/MINERALS CH
1.0000 | ORAL_TABLET | Freq: Every day | ORAL | Status: DC
  Administered 2020-03-28: 09:00:00 1 via ORAL
  Filled 2020-03-27: qty 1

## 2020-03-27 MED ORDER — THIAMINE HCL 100 MG PO TABS
100.0000 mg | ORAL_TABLET | Freq: Every day | ORAL | Status: DC
  Administered 2020-03-27 – 2020-03-28 (×2): 100 mg via ORAL
  Filled 2020-03-27 (×2): qty 1

## 2020-03-27 MED ORDER — ENSURE ENLIVE PO LIQD
237.0000 mL | Freq: Two times a day (BID) | ORAL | Status: DC
  Administered 2020-03-28 (×2): 237 mL via ORAL

## 2020-03-27 MED ORDER — ENOXAPARIN SODIUM 40 MG/0.4ML ~~LOC~~ SOLN
40.0000 mg | SUBCUTANEOUS | Status: DC
  Administered 2020-03-27: 40 mg via SUBCUTANEOUS
  Filled 2020-03-27: qty 0.4

## 2020-03-27 MED ORDER — FOLIC ACID 1 MG PO TABS
1.0000 mg | ORAL_TABLET | Freq: Every day | ORAL | Status: DC
  Administered 2020-03-27 – 2020-03-28 (×2): 1 mg via ORAL
  Filled 2020-03-27 (×2): qty 1

## 2020-03-27 MED ORDER — LORAZEPAM 2 MG/ML IJ SOLN: 1.0000 mg | INTRAMUSCULAR | Status: DC | PRN

## 2020-03-27 MED ORDER — ONDANSETRON HCL 4 MG PO TABS: 4.0000 mg | ORAL_TABLET | Freq: Four times a day (QID) | ORAL | Status: DC | PRN

## 2020-03-27 MED ORDER — METRONIDAZOLE IN NACL 5-0.79 MG/ML-% IV SOLN
500.0000 mg | Freq: Once | INTRAVENOUS | Status: AC
  Administered 2020-03-27: 500 mg via INTRAVENOUS
  Filled 2020-03-27: qty 100

## 2020-03-27 MED ORDER — SODIUM CHLORIDE 0.9 % IV BOLUS
1000.0000 mL | Freq: Once | INTRAVENOUS | Status: AC
  Administered 2020-03-27: 1000 mL via INTRAVENOUS

## 2020-03-27 MED ORDER — LORAZEPAM 1 MG PO TABS: 1.0000 mg | ORAL_TABLET | ORAL | Status: DC | PRN

## 2020-03-27 MED ORDER — IOHEXOL 300 MG/ML  SOLN
100.0000 mL | Freq: Once | INTRAMUSCULAR | Status: AC | PRN
  Administered 2020-03-27: 100 mL via INTRAVENOUS

## 2020-03-27 MED ORDER — METRONIDAZOLE 500 MG PO TABS
500.0000 mg | ORAL_TABLET | Freq: Three times a day (TID) | ORAL | Status: DC
  Administered 2020-03-27 – 2020-03-28 (×2): 500 mg via ORAL
  Filled 2020-03-27 (×2): qty 1

## 2020-03-27 MED ORDER — SODIUM CHLORIDE 0.9 % IV SOLN: INTRAVENOUS | Status: DC

## 2020-03-27 MED ORDER — METRONIDAZOLE IN NACL 5-0.79 MG/ML-% IV SOLN: 500.0000 mg | Freq: Three times a day (TID) | INTRAVENOUS | Status: DC

## 2020-03-27 MED ORDER — LEVOTHYROXINE SODIUM 112 MCG PO TABS
112.0000 ug | ORAL_TABLET | Freq: Every day | ORAL | Status: DC
  Administered 2020-03-28: 06:00:00 112 ug via ORAL
  Filled 2020-03-27: qty 1

## 2020-03-27 MED ORDER — AMITRIPTYLINE HCL 25 MG PO TABS
25.0000 mg | ORAL_TABLET | Freq: Every day | ORAL | Status: DC
  Administered 2020-03-27: 25 mg via ORAL
  Filled 2020-03-27: qty 1

## 2020-03-27 MED ORDER — ALPRAZOLAM 0.25 MG PO TABS
0.2500 mg | ORAL_TABLET | Freq: Two times a day (BID) | ORAL | Status: DC | PRN
  Administered 2020-03-27 – 2020-03-28 (×2): 0.25 mg via ORAL
  Filled 2020-03-27 (×2): qty 1

## 2020-03-27 MED ORDER — CIPROFLOXACIN IN D5W 400 MG/200ML IV SOLN
400.0000 mg | Freq: Once | INTRAVENOUS | Status: AC
  Administered 2020-03-27: 400 mg via INTRAVENOUS
  Filled 2020-03-27: qty 200

## 2020-03-27 MED ORDER — SODIUM CHLORIDE 0.9 % IV SOLN: Freq: Once | INTRAVENOUS | Status: DC

## 2020-03-27 MED ORDER — ACETAMINOPHEN 650 MG RE SUPP: 650.0000 mg | Freq: Four times a day (QID) | RECTAL | Status: DC | PRN

## 2020-03-27 MED ORDER — ACETAMINOPHEN 325 MG PO TABS: 650.0000 mg | ORAL_TABLET | Freq: Four times a day (QID) | ORAL | Status: DC | PRN

## 2020-03-27 MED ORDER — THIAMINE HCL 100 MG/ML IJ SOLN: 100.0000 mg | Freq: Every day | INTRAMUSCULAR | Status: DC

## 2020-03-27 MED ORDER — CIPROFLOXACIN IN D5W 400 MG/200ML IV SOLN
400.0000 mg | Freq: Two times a day (BID) | INTRAVENOUS | Status: DC
  Administered 2020-03-28: 400 mg via INTRAVENOUS
  Filled 2020-03-27: qty 200

## 2020-03-27 NOTE — H&P (Signed)
History and Physical    Colleen Owen SFK:[id number] DOB: 1955-10-01 DOA: 03/27/2020  PCP: Kristian Covey, MD  Patient coming from: Home  Chief Complaint: Diarrhea  HPI: Colleen Owen is a 64 y.o. female with medical history significant ofanxiety, hypothyroidism. Presenting with abdominal cramping and diarrhea. She reports that about a week about she had stomach pain and came for evaluation in the ED. She was told that she may have inflammatory bowel disease, and that she should follow up with GI for a c-scope. She was waiting for her insurance to kick in before going to see them. She reports that the cramps were intermittent and never fully went away. She woke up this morning and had these cramps across her lower stomach which prompted her to have a BM. She noted it to be watery stool w/ mucus. No blood. She became concerned and came back to the ED. No alleviating or aggravating factors noted. She tried xanax to make things better because she thought it was related to her anxiety.   ED Course: CT ab/pelvis in the ED revealed colitis. She was started on cipro and flagyl. TRH was called for admission.   Review of Systems:  Denies N/V, hematochezia, CP, palpitations, dyspnea, fever. Review of systems is otherwise negative for all not mentioned in HPI.   PMHx Past Medical History:  Diagnosis Date  . Acute bronchitis 02/20/2009  . ANXIETY 09/08/2008  . DEPRESSION 09/08/2008  . ELEVATED BLOOD PRESSURE 07/16/2009  . HYPERTENSION 09/08/2008  . HYPOTHYROIDISM 09/08/2008    PSHx Past Surgical History:  Procedure Laterality Date  . URETHRA SURGERY  2008    SocHx  reports that she has been smoking cigarettes. She has a 67.50 pack-year smoking history. She has never used smokeless tobacco. She reports current alcohol use of about 7.0 standard drinks of alcohol per week. She reports that she does not use drugs.  Allergies  Allergen Reactions  . Codeine Sulfate Nausea And Vomiting  .  Loratadine-Pseudoephedrine Er Other (See Comments)    "Wired, cannot sleep"    FamHx Family History  Problem Relation Age of Onset  . Cancer Mother        cancer  . Hypertension Mother   . Diabetes Father   . Stroke Father   . Alcohol abuse Maternal Grandfather     Prior to Admission medications   Medication Sig Start Date End Date Taking? Authorizing Provider  ALPRAZolam Prudy Feeler) 0.5 MG tablet Take 1 tablet by mouth twice daily as needed for anxiety Patient taking differently: Take 0.25 mg by mouth 2 (two) times daily as needed for anxiety.  03/19/20  Yes Burchette, Elberta Fortis, MD  amitriptyline (ELAVIL) 25 MG tablet Take 1 tablet (25 mg total) by mouth at bedtime. NEEDS ApPT FOR FURTHER REFILLS Patient taking differently: Take 25 mg by mouth at bedtime.  10/25/19  Yes Burchette, Elberta Fortis, MD  levothyroxine (SYNTHROID) 112 MCG tablet Take 1 tablet by mouth once daily Patient taking differently: Take 112 mcg by mouth daily.  03/19/20  Yes Burchette, Elberta Fortis, MD  Multiple Vitamin (MULTIVITAMIN) tablet Take 1 tablet by mouth daily.   Yes [provider]    Physical Exam: Vitals:   03/27/20 1047 03/27/20 1250  BP: (!) 166/111 128/73  Pulse: 99 79  Resp: 16 18  Temp: 98.1 F (36.7 C)   TempSrc: Oral   SpO2: 99% 97%    General: 64 y.o. female resting in bed in NAD Eyes: PERRL, normal sclera ENMT:  Nares patent w/o discharge, orophaynx clear, dentition normal, ears w/o discharge/lesions/ulcers Neck: Supple, trachea midline Cardiovascular: RRR, +S1, S2, no m/g/r, equal pulses throughout Respiratory: CTABL, no w/r/r, normal WOB GI: BS+, ND, slight TTP LLQ, no masses noted, no organomegaly noted MSK: No e/c/c Skin: No rashes, bruises, ulcerations noted Neuro: A&O x 3, no focal deficits Psyc: Appropriate interaction and affect, calm/cooperative  Labs on Admission: I have personally reviewed following labs and imaging studies  CBC: Recent Labs  Lab 03/27/20 1051  WBC  13.3*  HGB 15.4*  HCT 45.5  MCV 99.6  PLT 272   Basic Metabolic Panel: Recent Labs  Lab 03/27/20 1051  NA 140  K 4.0  CL 103  CO2 28  GLUCOSE 92  BUN <5*  CREATININE 0.64  CALCIUM 9.1   GFR: Estimated Creatinine Clearance: 63.6 mL/min (by C-G formula based on SCr of 0.64 mg/dL). Liver Function Tests: Recent Labs  Lab 03/27/20 1051  AST 34  ALT 15  ALKPHOS 65  BILITOT 1.0  PROT 7.7  ALBUMIN 5.0   Recent Labs  Lab 03/27/20 1051  LIPASE 30   No results for input(s): AMMONIA in the last 168 hours. Coagulation Profile: No results for input(s): INR, PROTIME in the last 168 hours. Cardiac Enzymes: No results for input(s): CKTOTAL, CKMB, CKMBINDEX, TROPONINI in the last 168 hours. BNP (last 3 results) No results for input(s): PROBNP in the last 8760 hours. HbA1C: No results for input(s): HGBA1C in the last 72 hours. CBG: No results for input(s): GLUCAP in the last 168 hours. Lipid Profile: No results for input(s): CHOL, HDL, LDLCALC, TRIG, CHOLHDL, LDLDIRECT in the last 72 hours. Thyroid Function Tests: No results for input(s): TSH, T4TOTAL, FREET4, T3FREE, THYROIDAB in the last 72 hours. Anemia Panel: No results for input(s): VITAMINB12, FOLATE, FERRITIN, TIBC, IRON, RETICCTPCT in the last 72 hours. Urine analysis:    Component Value Date/Time   COLORURINE YELLOW 03/27/2020 1140   APPEARANCEUR CLEAR 03/27/2020 1140   LABSPEC 1.003 (L) 03/27/2020 1140   PHURINE 6.0 03/27/2020 1140   GLUCOSEU NEGATIVE 03/27/2020 1140   HGBUR NEGATIVE 03/27/2020 1140   BILIRUBINUR NEGATIVE 03/27/2020 1140   BILIRUBINUR neg 09/24/2010 1154   KETONESUR 5 (A) 03/27/2020 1140   PROTEINUR NEGATIVE 03/27/2020 1140   UROBILINOGEN 0.2 09/24/2010 1154   NITRITE NEGATIVE 03/27/2020 1140   LEUKOCYTESUR MODERATE (A) 03/27/2020 1140    Radiological Exams on Admission: CT Abdomen Pelvis W Contrast  Result Date: 03/27/2020 CLINICAL DATA:  Abdominal pain and diarrhea beginning  yesterday. EXAM: CT ABDOMEN AND PELVIS WITH CONTRAST TECHNIQUE: Multidetector CT imaging of the abdomen and pelvis was performed using the standard protocol following bolus administration of intravenous contrast. CONTRAST:  OMNIPAQUE IOHEXOL 300 MG/ML  SOLN COMPARISON:  03/15/2020.  12/26/2016. FINDINGS: Lower chest: Normal Hepatobiliary: Mild fatty change of the liver. No focal liver lesions seen. No calcified gallstone. Pancreas: Normal Spleen: Normal Adrenals/Urinary Tract: Adrenal glands are normal. Kidneys are normal. No cyst, mass, stone or hydronephrosis. Bladder is normal. Stomach/Bowel: Mild edematous wall thickening of the cecum as seen on some of the previous imaging. This could be due to infectious colitis or inflammatory bowel disease. The appendix does not show evidence of appendicitis. No evidence of regional abscess. Patient has some sigmoid diverticulosis but I do not see evidence of diverticulitis. Vascular/Lymphatic: Aortic atherosclerosis.  No aneurysm. Reproductive: No pelvic mass. Other: No free fluid or air. Musculoskeletal: Ordinary lower lumbar degenerative changes. IMPRESSION: 1. Mild edematous wall thickening of the cecum  as seen on some of the previous imaging. This could be due to infectious colitis or inflammatory bowel disease. The appendix does not show evidence of appendicitis. 2. Sigmoid diverticulosis without evidence of diverticulitis. 3. Mild fatty change of the liver. 4. Aortic atherosclerosis. Aortic Atherosclerosis (ICD10-I70.0). Electronically Signed   By: Paulina Fusi M.D.   On: 03/27/2020 13:01   Assessment/Plan Abdominal Pain Colitis     - admit to obs, med-surg     - check c.diff, GI PCR     - started on cipro/flagyl in ED; can continue for now     - depending on stools studies, consider GI consult     - fluids  Anxiety     - continue anxiety meds  Hypothyroidism     - continue synthroid  Tobacco abuse EtOH abuse Mj abuse     - counseled against  further use of tobacco, mj, and EtOH     - nicotine patch, CIWAA protocol  DVT prophylaxis: lovenox  Code Status: DNR  Family Communication: None at bedside  Consults called: None  Status is: Observation  The patient remains OBS appropriate and will d/c before 2 midnights.  Dispo: The patient is from: Home              Anticipated d/c is to: Home              Anticipated d/c date is: 1 day              Patient currently is not medically stable to d/c.  Teddy Spike DO [facility]  If 7PM-7AM, please contact night-coverage [email or web address]  03/27/2020, 1:54 PM

## 2020-03-27 NOTE — ED Provider Notes (Signed)
[facility]-EMERGENCY DEPT Provider Note   CSN: [id number] Arrival date & time: 03/27/20  1042     History Chief Complaint  Patient presents with  . Abdominal Pain  . Diarrhea    Colleen Owen is a 64 y.o. female.  64 year old female presents with worsening abdominal cramping with associated diarrhea times about a year. Was scheduled see her doctor today but came here due to worsening diarrhea which she describes as watery. No fever or chills. No emesis appreciated. She has had some nausea. Abdominal discomfort has been diffuse and crampy in nature. No urinary symptoms. No vaginal bleeding or discharge. Denies any recent antibiotic use. Symptoms have been unresponsive to her current medication regimen.        Past Medical History:  Diagnosis Date  . Acute bronchitis 02/20/2009  . ANXIETY 09/08/2008  . DEPRESSION 09/08/2008  . ELEVATED BLOOD PRESSURE 07/16/2009  . HYPERTENSION 09/08/2008  . HYPOTHYROIDISM 09/08/2008    Patient Active Problem List   Diagnosis Date Noted  . Frequent loose stools 10/25/2019  . Chronic insomnia 08/18/2014  . COUGH 04/10/2010  . SKIN TAG 10/17/2009  . MALAISE 10/17/2009  . ELEVATED BLOOD PRESSURE 07/16/2009  . ACUTE BRONCHITIS 02/20/2009  . Hypothyroidism 09/08/2008  . Anxiety state 09/08/2008  . DEPRESSION 09/08/2008  . HYPERTENSION 09/08/2008    Past Surgical History:  Procedure Laterality Date  . URETHRA SURGERY  2008     OB History   No obstetric history on file.     Family History  Problem Relation Age of Onset  . Cancer Mother        cancer  . Hypertension Mother   . Diabetes Father   . Stroke Father   . Alcohol abuse Maternal Grandfather     Social History   Tobacco Use  . Smoking status: Current Every Day Smoker    Packs/day: 1.50    Years: 45.00    Pack years: 67.50    Types: Cigarettes  . Smokeless tobacco: Never Used  Substance Use Topics  . Alcohol use: Yes    Alcohol/week: 7.0 standard  drinks    Types: 7 Standard drinks or equivalent per week  . Drug use: No    Home Medications Prior to Admission medications   Medication Sig Start Date End Date Taking? Authorizing Provider  ALPRAZolam Prudy Feeler) 0.5 MG tablet Take 1 tablet by mouth twice daily as needed for anxiety 03/19/20   Burchette, Elberta Fortis, MD  amitriptyline (ELAVIL) 25 MG tablet Take 1 tablet (25 mg total) by mouth at bedtime. NEEDS ApPT FOR FURTHER REFILLS Patient taking differently: Take 25 mg by mouth at bedtime.  10/25/19   Burchette, Elberta Fortis, MD  levothyroxine (SYNTHROID) 112 MCG tablet Take 1 tablet by mouth once daily 03/19/20   Burchette, Elberta Fortis, MD  Multiple Vitamin (MULTIVITAMIN) tablet Take 1 tablet by mouth daily.    [provider]    Allergies    Codeine sulfate and Loratadine-pseudoephedrine er  Review of Systems   Review of Systems  All other systems reviewed and are negative.   Physical Exam Updated Vital Signs BP (!) 166/111 (BP Location: Left Arm)   Pulse 99   Temp 98.1 F (36.7 C) (Oral)   Resp 16   SpO2 99%   Physical Exam Vitals and nursing note reviewed.  Constitutional:      General: She is not in acute distress.    Appearance: Normal appearance. She is well-developed. She is not toxic-appearing.  HENT:  Head: Normocephalic and atraumatic.  Eyes:     General: Lids are normal.     Conjunctiva/sclera: Conjunctivae normal.     Pupils: Pupils are equal, round, and reactive to light.  Neck:     Thyroid: No thyroid mass.     Trachea: No tracheal deviation.  Cardiovascular:     Rate and Rhythm: Normal rate and regular rhythm.     Heart sounds: Normal heart sounds. No murmur heard.  No gallop.   Pulmonary:     Effort: Pulmonary effort is normal. No respiratory distress.     Breath sounds: Normal breath sounds. No stridor. No decreased breath sounds, wheezing, rhonchi or rales.  Abdominal:     General: Bowel sounds are normal. There is no distension.      Palpations: Abdomen is soft.     Tenderness: There is no abdominal tenderness. There is no rebound.  Genitourinary:    Rectum: No mass, external hemorrhoid or internal hemorrhoid.     Comments: No stool on digital rectal exam Musculoskeletal:        General: No tenderness. Normal range of motion.     Cervical back: Normal range of motion and neck supple.  Skin:    General: Skin is warm and dry.     Findings: No abrasion or rash.  Neurological:     Mental Status: She is alert and oriented to person, place, and time.     GCS: GCS eye subscore is 4. GCS verbal subscore is 5. GCS motor subscore is 6.     Cranial Nerves: No cranial nerve deficit.     Sensory: No sensory deficit.  Psychiatric:        Speech: Speech normal.        Behavior: Behavior normal.     ED Results / Procedures / Treatments   Labs (all labs ordered are listed, but only abnormal results are displayed) Labs Reviewed  LIPASE, BLOOD  COMPREHENSIVE METABOLIC PANEL  CBC  URINALYSIS, ROUTINE W REFLEX MICROSCOPIC    EKG None  Radiology No results found.  Procedures Procedures (including critical care time)  Medications Ordered in ED Medications  sodium chloride 0.9 % bolus 1,000 mL (has no administration in time range)  0.9 %  sodium chloride infusion (has no administration in time range)  morphine 4 MG/ML injection 4 mg (has no administration in time range)    ED Course  I have reviewed the triage vital signs and the nursing notes.  Pertinent labs & imaging results that were available during my care of the patient were reviewed by me and considered in my medical decision making (see chart for details).    MDM Rules/Calculators/A&P                          Patient given IV fluids here and pain medication here.  Normal CT consistent with colitis.  Will start on IV antibiotics and admit to the hospital service Final Clinical Impression(s) / ED Diagnoses Final diagnoses:  None    Rx / DC Orders ED  Discharge Orders    None       Lorre Nick, MD 03/27/20 1345

## 2020-03-27 NOTE — ED Triage Notes (Signed)
Pt presents with c/o abdominal pain and diarrhea that started last night. Pt also reports some nausea.

## 2020-03-28 DIAGNOSIS — A498 Other bacterial infections of unspecified site: Secondary | ICD-10-CM

## 2020-03-28 LAB — COMPREHENSIVE METABOLIC PANEL
ALT: 13 U/L (ref 0–44)
AST: 15 U/L (ref 15–41)
Albumin: 3.4 g/dL — ABNORMAL LOW (ref 3.5–5.0)
Alkaline Phosphatase: 53 U/L (ref 38–126)
Anion gap: 10 (ref 5–15)
BUN: 8 mg/dL (ref 8–23)
CO2: 23 mmol/L (ref 22–32)
Calcium: 8.3 mg/dL — ABNORMAL LOW (ref 8.9–10.3)
Chloride: 109 mmol/L (ref 98–111)
Creatinine, Ser: 0.7 mg/dL (ref 0.44–1.00)
GFR, Estimated: >60 mL/min (ref >60)
Glucose, Bld: 89 mg/dL (ref 70–99)
Potassium: 3.3 mmol/L — ABNORMAL LOW (ref 3.5–5.1)
Sodium: 142 mmol/L (ref 135–145)
Total Bilirubin: 0.6 mg/dL (ref 0.3–1.2)
Total Protein: 5.7 g/dL — ABNORMAL LOW (ref 6.5–8.1)

## 2020-03-28 LAB — GASTROINTESTINAL PANEL BY PCR, STOOL (REPLACES STOOL CULTURE)

## 2020-03-28 LAB — CBC
HCT: 39.9 % (ref 36.0–46.0)
Hemoglobin: 13.3 g/dL (ref 12.0–15.0)
MCH: 33.5 pg (ref 26.0–34.0)
MCHC: 33.3 g/dL (ref 30.0–36.0)
MCV: 100.5 fL — ABNORMAL HIGH (ref 80.0–100.0)
Platelets: 212 10*3/uL (ref 150–400)
RBC: 3.97 MIL/uL (ref 3.87–5.11)
RDW: 13.2 % (ref 11.5–15.5)
WBC: 6.8 10*3/uL (ref 4.0–10.5)
nRBC: 0 % (ref 0.0–0.2)

## 2020-03-28 MED ORDER — VANCOMYCIN 50 MG/ML ORAL SOLUTION
125.0000 mg | Freq: Four times a day (QID) | ORAL | Status: DC
  Administered 2020-03-28: 13:00:00 125 mg via ORAL
  Filled 2020-03-28 (×2): qty 2.5

## 2020-03-28 MED ORDER — VANCOMYCIN HCL 125 MG PO CAPS: 125.0000 mg | ORAL_CAPSULE | Freq: Four times a day (QID) | ORAL | 0 refills | Status: AC

## 2020-03-28 MED ORDER — POTASSIUM CHLORIDE CRYS ER 20 MEQ PO TBCR
40.0000 meq | EXTENDED_RELEASE_TABLET | Freq: Once | ORAL | Status: AC
  Administered 2020-03-28: 13:00:00 40 meq via ORAL
  Filled 2020-03-28: qty 2

## 2020-03-28 NOTE — TOC Benefit Eligibility Note (Signed)
Transition of Care [facility]) Benefit Eligibility Note    Patient Details  Name: Colleen Owen MRN: [id number] Date of Birth: 1955/09/17   Medication/Dose: Vancomycin 125mg . 4x a day  Covered?: Yes  Tier:  (Tier 4)  Prescription Coverage [facility]  Spoke with Person/Company/Phone Number:: Geni Bers. [employer] PH# [phone]  Co-Pay: Zero  Prior Approval: No  Deductible: Met       Shelda Altes Phone Number: 03/28/2020, 2:43 PM

## 2020-03-28 NOTE — Discharge Instructions (Signed)
Colleen Owen,  You were in the hospital because of diarrhea.  This appears to be secondary to an infection called Clostridium difficile.  You have been prescribed an antibiotic called vancomycin.  You need to take this for 10 days.  This should treat your C. difficile infection, however these infections have a high recurrence rate.

## 2020-03-28 NOTE — TOC Progression Note (Addendum)
Transition of Care [facility]) - Progression Note    Patient Details  Name: Colleen Owen MRN: [id number] Date of Birth: 11-Oct-1955  [facility] of [facility] CM/SW Contact  Clearance Coots, LCSW Phone Number: 03/28/2020, 4:50 PM  Clinical Narrative:    Vancomycin suspension 125mg  not available at preferred pharmacies. Patient will have to pay privately for non preferred.  CSW notified the physician. Patient switched to oral vancomycin. CSW confirm [facility] [address] [name] can fill her script. Patient sent with script.     Expected Discharge Plan: Home/Self Care Barriers to Discharge: Barriers Resolved  Expected Discharge Plan and Services Expected Discharge Plan: Home/Self Care         Expected Discharge Date: 03/28/20                                     Social Determinants of Health (SDOH) Interventions    Readmission Risk Interventions No flowsheet data found.

## 2020-03-28 NOTE — Progress Notes (Signed)
Discharge instructions given to pt and all questions were answered.  

## 2020-03-28 NOTE — Discharge Summary (Signed)
Physician Discharge Summary  Colleen Owen:[id number] DOB: January 10, 1956 DOA: 03/27/2020  PCP: Kristian Covey, MD  Admit date: 03/27/2020 Discharge date: 03/28/2020  Admitted From: Home Disposition: Home  Recommendations for Outpatient Follow-up:  1. Follow up with PCP in 1 week 2. Please obtain BMP/CBC in one week 3. Please follow up on the following pending results: None  Home Health: None Equipment/Devices: None  Discharge Condition: Stable CODE STATUS: DNR Diet recommendation: Heart healthy   Brief/Interim Summary:  Admission HPI written by Teddy Spike, DO   Chief Complaint: Diarrhea  HPI: Colleen Owen is a 64 y.o. female with medical history significant ofanxiety, hypothyroidism. Presenting with abdominal cramping and diarrhea. She reports that about a week about she had stomach pain and came for evaluation in the ED. She was told that she may have inflammatory bowel disease, and that she should follow up with GI for a c-scope. She was waiting for her insurance to kick in before going to see them. She reports that the cramps were intermittent and never fully went away. She woke up this morning and had these cramps across her lower stomach which prompted her to have a BM. She noted it to be watery stool w/ mucus. No blood. She became concerned and came back to the ED. No alleviating or aggravating factors noted. She tried xanax to make things better because she thought it was related to her anxiety.   ED Course: CT ab/pelvis in the ED revealed colitis. She was started on cipro and flagyl. [facility] was called for admission.    Hospital course:  Clostridium difficile colitis Patient presented with diarrhea with positive C. Difficile antigen and PCR; negative toxin. Patient reports diarrheal illness for about one year which acutely worsened with associated evidence of colitis on abdomen/pelvis. Patient initially treated with Ciprofloxacin and Flagyl and transitioned to  Vancomycin 125 mg PO to complete 10 days.  Anxiety Continue Xanax  Hypothyroidism Continue synthroid  Aortic atherosclerosis Outpatient follow-up. CT scan also suggests mild fatty change of the liver.  Sigmoid diverticulosis Seen on CT abdomen/pelvis.  Tobacco abuse Counseled on admission  Ethanol abuse Counseled on admission  Marijuana abuse Counseled on admission  Discharge Diagnoses:  Principal Problem:   Clostridium difficile infection Active Problems:   Diarrhea    Discharge Instructions  Discharge Instructions    Call MD for:  persistant nausea and vomiting   Complete by: As directed    Call MD for:  severe uncontrolled pain   Complete by: As directed    Call MD for:  temperature >100.4   Complete by: As directed    Increase activity slowly   Complete by: As directed      Allergies as of 03/28/2020      Reactions   Codeine Sulfate Nausea And Vomiting   Loratadine-pseudoephedrine Er Other (See Comments)   "Wired, cannot sleep"      Medication List    TAKE these medications   ALPRAZolam 0.5 MG tablet Commonly known as: XANAX Take 1 tablet by mouth twice daily as needed for anxiety What changed:   how much to take  reasons to take this  additional instructions   amitriptyline 25 MG tablet Commonly known as: ELAVIL Take 1 tablet (25 mg total) by mouth at bedtime. NEEDS ApPT FOR FURTHER REFILLS What changed: additional instructions   levothyroxine 112 MCG tablet Commonly known as: SYNTHROID Take 1 tablet by mouth once daily   multivitamin tablet Take 1 tablet by  mouth daily.   vancomycin 125 MG capsule Commonly known as: VANCOCIN Take 1 capsule (125 mg total) by mouth 4 (four) times daily for 10 days.       Follow-up Information    Burchette, Elberta FortisBruce W, MD. Schedule an appointment as soon as possible for a visit in 1 week(s).   Specialty: Family Medicine Why: Hospital follow-up Contact information: [address] [place] [id number] [phone]              Allergies  Allergen Reactions  . Codeine Sulfate Nausea And Vomiting  . Loratadine-Pseudoephedrine Er Other (See Comments)    "Wired, cannot sleep"    Consultations:  None   Procedures/Studies: DG Chest 2 View  Result Date: 03/15/2020 CLINICAL DATA:  Chest pain. EXAM: CHEST - 2 VIEW COMPARISON:  May 30, 2012. FINDINGS: The heart size and mediastinal contours are within normal limits. Both lungs are clear. No pneumothorax or pleural effusion is noted. Hyperexpansion of the lungs is noted. The visualized skeletal structures are unremarkable. IMPRESSION: No active cardiopulmonary disease. Electronically Signed   By: Lupita RaiderJames  Green Jr M.D.   On: 03/15/2020 12:21   CT Head Wo Contrast  Result Date: 03/15/2020 CLINICAL DATA:  Headache, new or worsening, neuro deficit. Additional history provided: Patient reports headaches for 1 week, left foot numbness, dizziness. EXAM: CT HEAD WITHOUT CONTRAST TECHNIQUE: Contiguous axial images were obtained from the base of the skull through the vertex without intravenous contrast. COMPARISON:  No pertinent prior exams are available for comparison. FINDINGS: Brain: Cerebral volume is normal for age. 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 There is no acute intracranial hemorrhage. No demarcated cortical infarct. No extra-axial fluid collection. No evidence of intracranial mass. No midline shift. Vascular: No hyperdense vessel.  Atherosclerotic calcifications. Skull: Normal. Negative for fracture or focal lesion. Sinuses/Orbits: Visualized orbits show no acute finding. Paranasal sinus mucosal thickening. Most notably, there is moderate ethmoid sinus mucosal thickening at the imaged levels. IMPRESSION: No evidence of acute intracranial abnormality. 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 Paranasal sinus mucosal thickening, most notably ethmoidal. Electronically  Signed   By: Jackey LogeKyle  Golden DO   On: 03/15/2020 14:34   CT Chest W Contrast  Result Date: 03/15/2020 CLINICAL DATA:  Acute chest and abdominal pain.  Weight loss. EXAM: CT CHEST, ABDOMEN, AND PELVIS WITH CONTRAST TECHNIQUE: Multidetector CT imaging of the chest, abdomen and pelvis was performed following the standard protocol during bolus administration of intravenous contrast. CONTRAST:  100mL OMNIPAQUE IOHEXOL 300 MG/ML  SOLN COMPARISON:  December 26, 2016.  February 13, 2006. FINDINGS: CT CHEST FINDINGS Cardiovascular: There is no evidence of thoracic aortic dissection or aneurysm. Normal cardiac size. No pericardial effusion. 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 Mediastinum/Nodes: No enlarged mediastinal, hilar, or axillary lymph nodes. Thyroid gland, trachea, and esophagus demonstrate no significant findings. Lungs/Pleura: No pneumothorax or pleural effusion is noted. Emphysematous disease is noted throughout both lungs. No acute pulmonary disease is noted. Musculoskeletal: No chest wall mass or suspicious bone lesions identified. CT ABDOMEN PELVIS FINDINGS Hepatobiliary: No focal liver abnormality is seen. No gallstones, gallbladder wall thickening, or biliary dilatation. Pancreas: Unremarkable. No pancreatic ductal dilatation or surrounding inflammatory changes. Spleen: Normal in size without focal abnormality. Adrenals/Urinary Tract: Adrenal glands are unremarkable. Kidneys are normal, without renal calculi, focal lesion, or hydronephrosis. Bladder is unremarkable. Stomach/Bowel: Stomach appears normal. There is no evidence of bowel obstruction.  The appendix appears normal. Mild wall thickening of the cecum is noted which may represent focal infectious or inflammatory colitis or typhlitis. Stool is noted throughout the colon. Vascular/Lymphatic: No significant vascular findings are present. No enlarged abdominal or  pelvic lymph nodes. Reproductive: Uterus and bilateral adnexa are unremarkable. Other: No abdominal wall hernia or abnormality. No abdominopelvic ascites. Musculoskeletal: No acute or significant osseous findings. IMPRESSION: 1. 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 2. Emphysematous disease is noted throughout both lungs. 3. Mild wall thickening of the cecum is noted which may represent focal infectious or inflammatory colitis or typhlitis. 4. No other abnormality seen in the chest, abdomen or pelvis. 5. Emphysema. Emphysema (ICD10-J43.9). Electronically Signed   By: Lupita Raider M.D.   On: 03/15/2020 14:45   CT Abdomen Pelvis W Contrast  Result Date: 03/27/2020 CLINICAL DATA:  Abdominal pain and diarrhea beginning yesterday. EXAM: CT ABDOMEN AND PELVIS WITH CONTRAST TECHNIQUE: Multidetector CT imaging of the abdomen and pelvis was performed using the standard protocol following bolus administration of intravenous contrast. CONTRAST:  OMNIPAQUE IOHEXOL 300 MG/ML  SOLN COMPARISON:  03/15/2020.  12/26/2016. FINDINGS: Lower chest: Normal Hepatobiliary: Mild fatty change of the liver. No focal liver lesions seen. No calcified gallstone. Pancreas: Normal Spleen: Normal Adrenals/Urinary Tract: Adrenal glands are normal. Kidneys are normal. No cyst, mass, stone or hydronephrosis. Bladder is normal. Stomach/Bowel: Mild edematous wall thickening of the cecum as seen on some of the previous imaging. This could be due to infectious colitis or inflammatory bowel disease. The appendix does not show evidence of appendicitis. No evidence of regional abscess. Patient has some sigmoid diverticulosis but I do not see evidence of diverticulitis. Vascular/Lymphatic: Aortic atherosclerosis.  No aneurysm. Reproductive: No pelvic mass. Other: No free fluid or air. Musculoskeletal: Ordinary lower lumbar degenerative changes. IMPRESSION:  1. Mild edematous wall thickening of the cecum as seen on some of the previous imaging. This could be due to infectious colitis or inflammatory bowel disease. The appendix does not show evidence of appendicitis. 2. Sigmoid diverticulosis without evidence of diverticulitis. 3. Mild fatty change of the liver. 4. Aortic atherosclerosis. Aortic Atherosclerosis (ICD10-I70.0). Electronically Signed   By: Paulina Fusi M.D.   On: 03/27/2020 13:01   CT ABDOMEN PELVIS W CONTRAST  Result Date: 03/15/2020 CLINICAL DATA:  Acute chest and abdominal pain.  Weight loss. EXAM: CT CHEST, ABDOMEN, AND PELVIS WITH CONTRAST TECHNIQUE: Multidetector CT imaging of the chest, abdomen and pelvis was performed following the standard protocol during bolus administration of intravenous contrast. CONTRAST:  OMNIPAQUE IOHEXOL 300 MG/ML  SOLN COMPARISON:  December 26, 2016.  February 13, 2006. FINDINGS: CT CHEST FINDINGS Cardiovascular: There is no evidence of thoracic aortic dissection or aneurysm. Normal cardiac size. No pericardial effusion. 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 Mediastinum/Nodes: No enlarged mediastinal, hilar, or axillary lymph nodes. Thyroid gland, trachea, and esophagus demonstrate no significant findings. Lungs/Pleura: No pneumothorax or pleural effusion is noted. Emphysematous disease is noted throughout both lungs. No acute pulmonary disease is noted. Musculoskeletal: No chest wall mass or suspicious bone lesions identified. CT ABDOMEN PELVIS FINDINGS Hepatobiliary: No focal liver abnormality is seen. No gallstones, gallbladder wall thickening, or biliary dilatation. Pancreas: Unremarkable. No pancreatic ductal dilatation or surrounding inflammatory changes. Spleen: Normal in size without focal abnormality. Adrenals/Urinary Tract: Adrenal glands are unremarkable. Kidneys are normal, without renal  calculi, focal lesion, or  hydronephrosis. Bladder is unremarkable. Stomach/Bowel: Stomach appears normal. There is no evidence of bowel obstruction. The appendix appears normal. Mild wall thickening of the cecum is noted which may represent focal infectious or inflammatory colitis or typhlitis. Stool is noted throughout the colon. Vascular/Lymphatic: No significant vascular findings are present. No enlarged abdominal or pelvic lymph nodes. Reproductive: Uterus and bilateral adnexa are unremarkable. Other: No abdominal wall hernia or abnormality. No abdominopelvic ascites. Musculoskeletal: No acute or significant osseous findings. IMPRESSION: 1. 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 2. Emphysematous disease is noted throughout both lungs. 3. Mild wall thickening of the cecum is noted which may represent focal infectious or inflammatory colitis or typhlitis. 4. No other abnormality seen in the chest, abdomen or pelvis. 5. Emphysema. Emphysema (ICD10-J43.9). Electronically Signed   By: Lupita Raider M.D.   On: 03/15/2020 14:45      Subjective: Continued diarrhea. Mild abdominal pain which improved from admission. Crampy pain.  Discharge Exam: Vitals:   03/27/20 2115 03/28/20 0518  BP: 126/84 132/89  Pulse: 81 75  Resp: 16 20  Temp: 99.8 F (37.7 C) 98 F (36.7 C)  SpO2: 95% 97%   Vitals:   03/27/20 1510 03/27/20 1802 03/27/20 2115 03/28/20 0518  BP: (!) 134/103 125/86 126/84 132/89  Pulse: 79 75 81 75  Resp: 17 16 16 20   Temp: 98 F (36.7 C) 97.9 F (36.6 C) 99.8 F (37.7 C) 98 F (36.7 C)  TempSrc: Oral Oral Oral Oral  SpO2: 97% 97% 95% 97%  Weight: 56.7 kg     Height: 5\' 5"  (1.651 m)       General: Pt is alert, awake, not in acute distress Cardiovascular: RRR, S1/S2 +, no rubs, no gallops Respiratory: CTA bilaterally, no wheezing, no rhonchi Abdominal: Soft, mildly tender, ND, bowel sounds + Extremities: no  edema, no cyanosis    The results of significant diagnostics from this hospitalization (including imaging, microbiology, ancillary and laboratory) are listed below for reference.     Microbiology: Recent Results (from the past 240 hour(s))  Resp Panel by RT-PCR (Flu A&B, Covid) Nasopharyngeal Swab     Status: None   Collection Time: 03/27/20  1:26 PM   Specimen: Nasopharyngeal Swab; Nasopharyngeal(NP) swabs in vial transport medium  Result Value Ref Range Status   SARS Coronavirus 2 by RT PCR NEGATIVE NEGATIVE Final    Comment: (NOTE) SARS-CoV-2 target nucleic acids are NOT DETECTED.  The SARS-CoV-2 RNA is generally detectable in upper respiratory specimens during the acute phase of infection. The lowest concentration of SARS-CoV-2 viral copies this assay can detect is 138 copies/mL. A negative result does not preclude SARS-Cov-2 infection and should not be used as the sole basis for treatment or other patient management decisions. A negative result may occur with  improper specimen collection/handling, submission of specimen other than nasopharyngeal swab, presence of viral mutation(s) within the areas targeted by this assay, and inadequate number of viral copies(<138 copies/mL). A negative result must be combined with clinical observations, patient history, and epidemiological information. The expected result is Negative.  Fact Sheet for Patients:   Fact Sheet for Healthcare Providers:  03/29/20  This test is no t yet approved or cleared by the [employer] FDA and  has been authorized for detection and/or diagnosis of SARS-CoV-2 by FDA under an Emergency Use Authorization (EUA). This EUA will remain  in effect (meaning  this test can be used) for the duration of the COVID-19 declaration under Section 564(b)(1) of the Act, 21 U.S.C.section 360bbb-3(b)(1), unless the authorization is terminated  or  revoked sooner.       Influenza A by PCR NEGATIVE NEGATIVE Final   Influenza B by PCR NEGATIVE NEGATIVE Final    Comment: (NOTE) The Xpert Xpress SARS-CoV-2/FLU/RSV plus assay is intended as an aid in the diagnosis of influenza from Nasopharyngeal swab specimens and should not be used as a sole basis for treatment. Nasal washings and aspirates are unacceptable for Xpert Xpress SARS-CoV-2/FLU/RSV testing.  Fact Sheet for Patients: BloggerCourse.com  Fact Sheet for Healthcare Providers: SeriousBroker.it  This test is not yet approved or cleared by the [place] FDA and has been authorized for detection and/or diagnosis of SARS-CoV-2 by FDA under an Emergency Use Authorization (EUA). This EUA will remain in effect (meaning this test can be used) for the duration of the COVID-19 declaration under Section 564(b)(1) of the Act, 21 U.S.C. section 360bbb-3(b)(1), unless the authorization is terminated or revoked.  Performed at [facility], [address] [place], [place] [address]   Gastrointestinal Panel by PCR , Stool     Status: None   Collection Time: 03/27/20  3:43 PM   Specimen: Stool  Result Value Ref Range Status   Campylobacter species NOT DETECTED NOT DETECTED Final   Plesimonas shigelloides NOT DETECTED NOT DETECTED Final   Salmonella species NOT DETECTED NOT DETECTED Final   Yersinia enterocolitica NOT DETECTED NOT DETECTED Final   Vibrio species NOT DETECTED NOT DETECTED Final   Vibrio cholerae NOT DETECTED NOT DETECTED Final   Enteroaggregative E coli (EAEC) NOT DETECTED NOT DETECTED Final   Enteropathogenic E coli (EPEC) NOT DETECTED NOT DETECTED Final   Enterotoxigenic E coli (ETEC) NOT DETECTED NOT DETECTED Final   Shiga like toxin producing E coli (STEC) NOT DETECTED NOT DETECTED Final   Shigella/Enteroinvasive E coli (EIEC) NOT DETECTED NOT DETECTED Final   Cryptosporidium NOT DETECTED  NOT DETECTED Final   Cyclospora cayetanensis NOT DETECTED NOT DETECTED Final   Entamoeba histolytica NOT DETECTED NOT DETECTED Final   Giardia lamblia NOT DETECTED NOT DETECTED Final   Adenovirus F40/41 NOT DETECTED NOT DETECTED Final   Astrovirus NOT DETECTED NOT DETECTED Final   Norovirus GI/GII NOT DETECTED NOT DETECTED Final   Rotavirus A NOT DETECTED NOT DETECTED Final   Sapovirus (I, II, IV, and V) NOT DETECTED NOT DETECTED Final    Comment: Performed at [facility], [address] [place], [place] [address]  C Difficile Quick Screen w PCR reflex     Status: Abnormal   Collection Time: 03/27/20  3:43 PM   Specimen: Stool  Result Value Ref Range Status   C Diff antigen POSITIVE (A) NEGATIVE Final   C Diff toxin NEGATIVE NEGATIVE Final   C Diff interpretation Results are indeterminate. See PCR results.  Final    Comment: Performed at [facility], [address] [place], [place] [address]  C. Diff by PCR, Reflexed     Status: Abnormal   Collection Time: 03/27/20  3:43 PM  Result Value Ref Range Status   Toxigenic C. Difficile by PCR POSITIVE (A) NEGATIVE Final    Comment: Positive for toxigenic C. difficile with little to no toxin production. Only treat if clinical presentation suggests symptomatic illness. Performed at [facility], [address] [place], [place] [address]      Labs: BNP (last 3  results) No results for input(s): BNP in the last 8760 hours. Basic Metabolic Panel: Recent Labs  Lab 03/27/20 1051 03/28/20 0541  NA 140 142  K 4.0 3.3*  CL 103 109  CO2 28 23  GLUCOSE 92 89  BUN <5* 8  CREATININE 0.64 0.70  CALCIUM 9.1 8.3*   Liver Function Tests: Recent Labs  Lab 03/27/20 1051 03/28/20 0541  AST 34 15  ALT 15 13  ALKPHOS 65 53  BILITOT 1.0 0.6  PROT 7.7 5.7*  ALBUMIN 5.0 3.4*   Recent Labs  Lab 03/27/20 1051  LIPASE 30   No results for input(s): AMMONIA in the last 168 hours. CBC: Recent Labs  Lab  03/27/20 1051 03/28/20 0541  WBC 13.3* 6.8  HGB 15.4* 13.3  HCT 45.5 39.9  MCV 99.6 100.5*  PLT 272 212   Cardiac Enzymes: No results for input(s): CKTOTAL, CKMB, CKMBINDEX, TROPONINI in the last 168 hours. BNP: Invalid input(s): POCBNP CBG: No results for input(s): GLUCAP in the last 168 hours. D-Dimer No results for input(s): DDIMER in the last 72 hours. Hgb A1c No results for input(s): HGBA1C in the last 72 hours. Lipid Profile No results for input(s): CHOL, HDL, LDLCALC, TRIG, CHOLHDL, LDLDIRECT in the last 72 hours. Thyroid function studies No results for input(s): TSH, T4TOTAL, T3FREE, THYROIDAB in the last 72 hours.  Invalid input(s): FREET3 Anemia work up No results for input(s): VITAMINB12, FOLATE, FERRITIN, TIBC, IRON, RETICCTPCT in the last 72 hours. Urinalysis    Component Value Date/Time   COLORURINE YELLOW 03/27/2020 1140   APPEARANCEUR CLEAR 03/27/2020 1140   LABSPEC 1.003 (L) 03/27/2020 1140   PHURINE 6.0 03/27/2020 1140   GLUCOSEU NEGATIVE 03/27/2020 1140   HGBUR NEGATIVE 03/27/2020 1140   BILIRUBINUR NEGATIVE 03/27/2020 1140   BILIRUBINUR neg 09/24/2010 1154   KETONESUR 5 (A) 03/27/2020 1140   PROTEINUR NEGATIVE 03/27/2020 1140   UROBILINOGEN 0.2 09/24/2010 1154   NITRITE NEGATIVE 03/27/2020 1140   LEUKOCYTESUR MODERATE (A) 03/27/2020 1140   Sepsis Labs Invalid input(s): PROCALCITONIN,  WBC,  LACTICIDVEN Microbiology Recent Results (from the past 240 hour(s))  Resp Panel by RT-PCR (Flu A&B, Covid) Nasopharyngeal Swab     Status: None   Collection Time: 03/27/20  1:26 PM   Specimen: Nasopharyngeal Swab; Nasopharyngeal(NP) swabs in vial transport medium  Result Value Ref Range Status   SARS Coronavirus 2 by RT PCR NEGATIVE NEGATIVE Final    Comment: (NOTE) SARS-CoV-2 target nucleic acids are NOT DETECTED.  The SARS-CoV-2 RNA is generally detectable in upper respiratory specimens during the acute phase of infection. The lowest concentration of  SARS-CoV-2 viral copies this assay can detect is 138 copies/mL. A negative result does not preclude SARS-Cov-2 infection and should not be used as the sole basis for treatment or other patient management decisions. A negative result may occur with  improper specimen collection/handling, submission of specimen other than nasopharyngeal swab, presence of viral mutation(s) within the areas targeted by this assay, and inadequate number of viral copies(<138 copies/mL). A negative result must be combined with clinical observations, patient history, and epidemiological information. The expected result is Negative.  Fact Sheet for Patients:  [employer]Course.com  Fact Sheet for Healthcare Providers:  SeriousBroker.it  This test is no t yet approved or cleared by the [place] FDA and  has been authorized for detection and/or diagnosis of SARS-CoV-2 by FDA under an Emergency Use Authorization (EUA). This EUA will remain  in effect (meaning this test can be used) for the duration  of the COVID-19 declaration under Section 564(b)(1) of the Act, 21 U.S.C.section 360bbb-3(b)(1), unless the authorization is terminated  or revoked sooner.       Influenza A by PCR NEGATIVE NEGATIVE Final   Influenza B by PCR NEGATIVE NEGATIVE Final    Comment: (NOTE) The Xpert Xpress SARS-CoV-2/FLU/RSV plus assay is intended as an aid in the diagnosis of influenza from Nasopharyngeal swab specimens and should not be used as a sole basis for treatment. Nasal washings and aspirates are unacceptable for Xpert Xpress SARS-CoV-2/FLU/RSV testing.  Fact Sheet for Patients: BloggerCourse.com  Fact Sheet for Healthcare Providers: SeriousBroker.it  This test is not yet approved or cleared by the [place] FDA and has been authorized for detection and/or diagnosis of SARS-CoV-2 by FDA under an Emergency Use  Authorization (EUA). This EUA will remain in effect (meaning this test can be used) for the duration of the COVID-19 declaration under Section 564(b)(1) of the Act, 21 U.S.C. section 360bbb-3(b)(1), unless the authorization is terminated or revoked.  Performed at [facility], [address], [place], [place] [address]   Gastrointestinal Panel by PCR , Stool     Status: None   Collection Time: 03/27/20  3:43 PM   Specimen: Stool  Result Value Ref Range Status   Campylobacter species NOT DETECTED NOT DETECTED Final   Plesimonas shigelloides NOT DETECTED NOT DETECTED Final   Salmonella species NOT DETECTED NOT DETECTED Final   Yersinia enterocolitica NOT DETECTED NOT DETECTED Final   Vibrio species NOT DETECTED NOT DETECTED Final   Vibrio cholerae NOT DETECTED NOT DETECTED Final   Enteroaggregative E coli (EAEC) NOT DETECTED NOT DETECTED Final   Enteropathogenic E coli (EPEC) NOT DETECTED NOT DETECTED Final   Enterotoxigenic E coli (ETEC) NOT DETECTED NOT DETECTED Final   Shiga like toxin producing E coli (STEC) NOT DETECTED NOT DETECTED Final   Shigella/Enteroinvasive E coli (EIEC) NOT DETECTED NOT DETECTED Final   Cryptosporidium NOT DETECTED NOT DETECTED Final   Cyclospora cayetanensis NOT DETECTED NOT DETECTED Final   Entamoeba histolytica NOT DETECTED NOT DETECTED Final   Giardia lamblia NOT DETECTED NOT DETECTED Final   Adenovirus F40/41 NOT DETECTED NOT DETECTED Final   Astrovirus NOT DETECTED NOT DETECTED Final   Norovirus GI/GII NOT DETECTED NOT DETECTED Final   Rotavirus A NOT DETECTED NOT DETECTED Final   Sapovirus (I, II, IV, and V) NOT DETECTED NOT DETECTED Final    Comment: Performed at [facility], [address] [place], [place] [address]  C Difficile Quick Screen w PCR reflex     Status: Abnormal   Collection Time: 03/27/20  3:43 PM   Specimen: Stool  Result Value Ref Range Status   C Diff antigen POSITIVE (A) NEGATIVE Final   C  Diff toxin NEGATIVE NEGATIVE Final   C Diff interpretation Results are indeterminate. See PCR results.  Final    Comment: Performed at [facility], [address] [place], [place] [address]  C. Diff by PCR, Reflexed     Status: Abnormal   Collection Time: 03/27/20  3:43 PM  Result Value Ref Range Status   Toxigenic C. Difficile by PCR POSITIVE (A) NEGATIVE Final    Comment: Positive for toxigenic C. difficile with little to no toxin production. Only treat if clinical presentation suggests symptomatic illness. Performed at [facility], [address], [place], [place] [address]      SIGNED:   Jacquelin Hawking, MD [facility] 03/28/2020, 4:00 PM

## 2020-03-28 NOTE — Progress Notes (Signed)
Initial Nutrition Assessment  RD working remotely.  DOCUMENTATION CODES:   Not applicable  INTERVENTION:  - continue Ensure Enlive BID, each supplement provides 350 kcal and 20 grams of protein. - will complete NFPE at follow-up.   NUTRITION DIAGNOSIS:   Inadequate oral intake related to acute illness, diarrhea as evidenced by per patient/family report.  GOAL:   Patient will meet greater than or equal to 90% of their needs  MONITOR:   PO intake, Supplement acceptance, Labs, Weight trends  REASON FOR ASSESSMENT:   Malnutrition Screening Tool  ASSESSMENT:   64 y.o. female with medical history of anxiety, depression, HTN, and hypothyroidism. She presented to the ED due to abdominal cramping and diarrhea x1 week.  Diet advanced from NPO to Heart Healthy yesterday at 1640. Patient ate 0% of dinner last night; 60% of breakfast and 100% of lunch today without issue or discomfort.   Ensure Enlive was ordered BID and she has accepted the one bottle offered to her so far.   Patient reports decreased appetite and intakes over the past 1 year with associated 30 lb weight loss during that time frame, in part related to recent episodes of abdominal pain.   Weight yesterday was documented as 125 lb and appears to be a stated weight. Weight on 10/25/19 was 137 lb. This indicates 12 lb weight loss (8.7% body weight) in the past 5 months.   Per notes: - abdominal pain d/t colitis - pending cdiff and GI panel results - OBV status   Labs reviewed; K: 3.3 mmol/l, Ca: 8.3 mg/dl. Medications reviewed; 1 mg folvite/day, 112 mcg oral synthroid/day, 40 mEq Klor-Con x1 dose today, 100 mg oral thiamine/day.  IVF; NS @ 125 ml/hr.     NUTRITION - FOCUSED PHYSICAL EXAM:  unable to complete at this time.   Diet Order:   Diet Order            Diet Heart Room service appropriate? Yes; Fluid consistency: Thin  Diet effective now                 EDUCATION NEEDS:   No education needs have  been identified at this time  Skin:  Skin Assessment: Reviewed RN Assessment  Last BM:  11/24  Height:   Ht Readings from Last 1 Encounters:  03/27/20 5\' 5"  (1.651 m)    Weight:   Wt Readings from Last 1 Encounters:  03/27/20 56.7 kg    Estimated Nutritional Needs:  Kcal:  1450-1650 kcal Protein:  65-75 grams Fluid:  >/= 1.8 L/day      03/29/20, MS, RD, LDN, CNSC Inpatient Clinical Dietitian RD pager # available in [facility]  After hours/weekend pager # available in [facility]

## 2020-09-15 DIAGNOSIS — J438 Other emphysema: Secondary | ICD-10-CM | POA: Insufficient documentation

## 2021-03-21 ENCOUNTER — Other Ambulatory Visit: Payer: Self-pay | Admitting: Family Medicine

## 2021-09-16 DIAGNOSIS — E782 Mixed hyperlipidemia: Secondary | ICD-10-CM | POA: Insufficient documentation

## 2021-11-05 ENCOUNTER — Emergency Department (HOSPITAL_COMMUNITY)
Admission: EM | Admit: 2021-11-05 | Discharge: 2021-11-05 | Discharge status: Against Medical Advice | Payer: Medicare Other | Attending: Emergency Medicine | Admitting: Emergency Medicine

## 2021-11-05 ENCOUNTER — Encounter (HOSPITAL_COMMUNITY): Payer: Self-pay

## 2021-11-05 DIAGNOSIS — Z5321 Procedure and treatment not carried out due to patient leaving prior to being seen by health care provider: Secondary | ICD-10-CM | POA: Diagnosis not present

## 2021-11-05 DIAGNOSIS — R197 Diarrhea, unspecified: Secondary | ICD-10-CM | POA: Insufficient documentation

## 2021-11-05 DIAGNOSIS — R1030 Lower abdominal pain, unspecified: Secondary | ICD-10-CM | POA: Diagnosis not present

## 2021-11-05 NOTE — ED Provider Triage Note (Signed)
Emergency Medicine Provider Triage Evaluation Note  Colleen Owen , a 66 y.o. female  was evaluated in triage.  Pt complains of diarrhea and lower abdominal cramping for the past 3 days. The patient reports she has a h/o C Diff from [facility]. [place] having the lower abdominal cramping and diarrhea again. Reports 2 episodes of non bloody and non melanotic stool. Denies any fever, nausea, or vomiting. No recent antibiotics.   Review of Systems  Positive:  Negative:   Physical Exam  BP (!) 155/107 (BP Location: Left Arm)   Pulse 100   Temp 98.2 F (36.8 C) (Oral)   Resp 20   SpO2 98%  Gen:   Awake, no distress   Resp:  Normal effort  MSK:   Moves extremities without difficulty  Other:  Mild tenderness to lower abdomen. Soft. No guarding or rebound.   Medical Decision Making  Medically screening exam initiated at 12:04 PM.  Appropriate orders placed.  Colleen Owen was informed that the remainder of the evaluation will be completed by another provider, this initial triage assessment does not replace that evaluation, and the importance of remaining in the ED until their evaluation is complete.  Ordered basic labs and C diff testing.    Achille Rich, [place] 11/05/21 1207

## 2021-11-05 NOTE — ED Triage Notes (Signed)
Pt arrived via POV, c/o diarrhea, states hx of c-diff. States feels the same. Was seen by PCP, put on flagyl . States that does not work and has not taken it.

## 2023-07-15 ENCOUNTER — Encounter (HOSPITAL_BASED_OUTPATIENT_CLINIC_OR_DEPARTMENT_OTHER): Payer: Self-pay | Admitting: Emergency Medicine

## 2023-07-15 ENCOUNTER — Ambulatory Visit: Payer: Self-pay | Admitting: Family Medicine

## 2023-07-15 ENCOUNTER — Emergency Department (HOSPITAL_BASED_OUTPATIENT_CLINIC_OR_DEPARTMENT_OTHER)
Admission: EM | Admit: 2023-07-15 | Discharge: 2023-07-15 | Disposition: A | Discharge status: Home / Self Care | Attending: Emergency Medicine | Admitting: Emergency Medicine

## 2023-07-15 ENCOUNTER — Emergency Department (HOSPITAL_BASED_OUTPATIENT_CLINIC_OR_DEPARTMENT_OTHER)

## 2023-07-15 ENCOUNTER — Other Ambulatory Visit: Payer: Self-pay

## 2023-07-15 DIAGNOSIS — R197 Diarrhea, unspecified: Secondary | ICD-10-CM | POA: Diagnosis present

## 2023-07-15 DIAGNOSIS — E039 Hypothyroidism, unspecified: Secondary | ICD-10-CM | POA: Insufficient documentation

## 2023-07-15 DIAGNOSIS — F17200 Nicotine dependence, unspecified, uncomplicated: Secondary | ICD-10-CM | POA: Insufficient documentation

## 2023-07-15 DIAGNOSIS — Z79899 Other long term (current) drug therapy: Secondary | ICD-10-CM | POA: Insufficient documentation

## 2023-07-15 DIAGNOSIS — R1084 Generalized abdominal pain: Secondary | ICD-10-CM | POA: Diagnosis not present

## 2023-07-15 DIAGNOSIS — I10 Essential (primary) hypertension: Secondary | ICD-10-CM | POA: Diagnosis not present

## 2023-07-15 HISTORY — DX: Psoriasis, unspecified: L40.9

## 2023-07-15 LAB — LIPASE, BLOOD: Lipase: 41 U/L (ref 11–51)

## 2023-07-15 LAB — CBC
HCT: 44.4 % (ref 36.0–46.0)
Hemoglobin: 15.2 g/dL — ABNORMAL HIGH (ref 12.0–15.0)
MCH: 33.3 pg (ref 26.0–34.0)
MCHC: 34.2 g/dL (ref 30.0–36.0)
MCV: 97.4 fL (ref 80.0–100.0)
Platelets: 287 10*3/uL (ref 150–400)
RBC: 4.56 MIL/uL (ref 3.87–5.11)
RDW: 12.4 % (ref 11.5–15.5)
WBC: 7.9 10*3/uL (ref 4.0–10.5)
nRBC: 0 % (ref 0.0–0.2)

## 2023-07-15 LAB — COMPREHENSIVE METABOLIC PANEL
ALT: 11 U/L (ref 0–44)
AST: 18 U/L (ref 15–41)
Albumin: 4.6 g/dL (ref 3.5–5.0)
Alkaline Phosphatase: 96 U/L (ref 38–126)
Anion gap: 9 (ref 5–15)
BUN: 11 mg/dL (ref 8–23)
CO2: 26 mmol/L (ref 22–32)
Calcium: 9.9 mg/dL (ref 8.9–10.3)
Chloride: 104 mmol/L (ref 98–111)
Creatinine, Ser: 0.65 mg/dL (ref 0.44–1.00)
GFR, Estimated: >60 mL/min (ref >60)
Glucose, Bld: 92 mg/dL (ref 70–99)
Potassium: 3.7 mmol/L (ref 3.5–5.1)
Sodium: 139 mmol/L (ref 135–145)
Total Bilirubin: 0.3 mg/dL (ref 0.0–1.2)
Total Protein: 7.6 g/dL (ref 6.5–8.1)

## 2023-07-15 LAB — URINALYSIS, ROUTINE W REFLEX MICROSCOPIC
Bacteria, UA: NONE SEEN
Bilirubin Urine: NEGATIVE
Glucose, UA: NEGATIVE mg/dL
Hgb urine dipstick: NEGATIVE
Ketones, ur: NEGATIVE mg/dL
Nitrite: NEGATIVE
Protein, ur: NEGATIVE mg/dL
Specific Gravity, Urine: 1.008 (ref 1.005–1.030)
pH: 5.5 (ref 5.0–8.0)

## 2023-07-15 MED ORDER — SODIUM CHLORIDE 0.9 % IV BOLUS
1000.0000 mL | Freq: Once | INTRAVENOUS | Status: AC
  Administered 2023-07-15: 1000 mL via INTRAVENOUS

## 2023-07-15 NOTE — ED Provider Notes (Signed)
  EMERGENCY DEPARTMENT AT [facility] Provider Note   CSN: [id number] Arrival date & time: 07/15/23  1336     History  Chief Complaint  Patient presents with   Diarrhea    Colleen Owen is a 68 y.o. female.  Patient with onset of diarrhea since Saturday.  Multiple episodes a day.  Patient was concerned that this is a reoccurrence of C. difficile she had it several years ago.  Went to see her primary care doctor's office but the results were not back because from the office it takes a week she is here really to get a C. difficile check.  Patient has had generalized abdominal pain with this that has been constant is not just crampy in nature.  No blood in the bowel movements no vomiting.  Patient's temp is 98.6 blood pressure 156/96 pulse 88 respiration 17 oxygen saturations room air 99%.  Past medical history sniffer hypothyroidism anxiety depression hypertension acute bronchitis hypertension patient is an everyday smoker.       Home Medications Prior to Admission medications   Medication Sig Start Date End Date Taking? Authorizing Provider  ALPRAZolam Prudy Feeler) 0.5 MG tablet Take 1 tablet by mouth twice daily as needed for anxiety Patient taking differently: Take 0.25 mg by mouth 2 (two) times daily as needed for anxiety.  03/19/20   Burchette, Elberta Fortis, MD  amitriptyline (ELAVIL) 25 MG tablet Take 1 tablet (25 mg total) by mouth at bedtime. NEEDS ApPT FOR FURTHER REFILLS Patient taking differently: Take 25 mg by mouth at bedtime.  10/25/19   Burchette, Elberta Fortis, MD  levothyroxine (SYNTHROID) 112 MCG tablet Take 1 tablet (112 mcg total) by mouth daily. 03/21/21   Burchette, Elberta Fortis, MD  Multiple Vitamin (MULTIVITAMIN) tablet Take 1 tablet by mouth daily.    [provider]      Allergies    Codeine sulfate and Loratadine-pseudoephedrine er    Review of Systems   Review of Systems  Constitutional:  Negative for chills and fever.  HENT:  Negative for ear  pain and sore throat.   Eyes:  Negative for pain and visual disturbance.  Respiratory:  Negative for cough and shortness of breath.   Cardiovascular:  Negative for chest pain and palpitations.  Gastrointestinal:  Positive for abdominal pain and diarrhea. Negative for blood in stool and vomiting.  Genitourinary:  Negative for dysuria and hematuria.  Musculoskeletal:  Negative for arthralgias and back pain.  Skin:  Negative for color change and rash.  Neurological:  Negative for seizures and syncope.  All other systems reviewed and are negative.   Physical Exam Updated Vital Signs BP (!) 153/92   Pulse 77   Temp 98.6 F (37 C)   Resp 16   Ht 1.651 m (5\' 5" )   Wt 56.7 kg   SpO2 98%   BMI 20.80 kg/m  Physical Exam Vitals and nursing note reviewed.  Constitutional:      General: She is not in acute distress.    Appearance: Normal appearance. She is well-developed. She is not ill-appearing.  HENT:     Head: Normocephalic and atraumatic.     Mouth/Throat:     Mouth: Mucous membranes are moist.  Eyes:     Extraocular Movements: Extraocular movements intact.     Conjunctiva/sclera: Conjunctivae normal.     Pupils: Pupils are equal, round, and reactive to light.  Cardiovascular:     Rate and Rhythm: Normal rate and regular rhythm.  Heart sounds: No murmur heard. Pulmonary:     Effort: Pulmonary effort is normal. No respiratory distress.     Breath sounds: Normal breath sounds.  Abdominal:     General: There is no distension.     Palpations: Abdomen is soft.     Tenderness: There is abdominal tenderness. There is no guarding.     Comments: Some generalized tenderness but no guarding no distention  Musculoskeletal:        General: No swelling.     Cervical back: Normal range of motion and neck supple.  Skin:    General: Skin is warm and dry.     Capillary Refill: Capillary refill takes less than 2 seconds.  Neurological:     General: No focal deficit present.     Mental  Status: She is alert and oriented to person, place, and time.  Psychiatric:        Mood and Affect: Mood normal.     ED Results / Procedures / Treatments   Labs (all labs ordered are listed, but only abnormal results are displayed) Labs Reviewed  CBC - Abnormal; Notable for the following components:      Result Value   Hemoglobin 15.2 (*)    All other components within normal limits  URINALYSIS, ROUTINE W REFLEX MICROSCOPIC - Abnormal; Notable for the following components:   Leukocytes,Ua MODERATE (*)    All other components within normal limits  C DIFFICILE QUICK SCREEN W PCR REFLEX    GASTROINTESTINAL PANEL BY PCR, STOOL (REPLACES STOOL CULTURE)  LIPASE, BLOOD  COMPREHENSIVE METABOLIC PANEL    EKG None  Radiology No results found.  Procedures Procedures    Medications Ordered in ED Medications  sodium chloride 0.9 % bolus 1,000 mL (1,000 mLs Intravenous New Bag/Given 07/15/23 1556)    ED Course/ Medical Decision Making/ A&P                                 Medical Decision Making Amount and/or Complexity of Data Reviewed Labs: ordered. Radiology: ordered.   Gave patient 500 cc normal saline bolus.  Did recommend getting CT scan of the abdomen and pelvis because of the generalized constant abdominal pain that does not seem to be crampy in nature.  Also sent stool panel and C. difficile screening.  Patient's labs complete metabolic panel normal lipase normal white count normal hemoglobin is good at 15.2 platelets are good urinalysis had moderate leukocytes white blood cells were 11-20 no bacteria seen nitrite negative.  Patient does not want to wait for CT scan abdomen pelvis or for her C. difficile results.  Stool has been collected.  And they are pending.  She will follow-up with her primary care doctor.  Precautions provided. Final Clinical Impression(s) / ED Diagnoses Final diagnoses:  Diarrhea, unspecified type  Generalized abdominal pain    Rx / DC  Orders ED Discharge Orders     None         Vanetta Mulders, MD 07/15/23 1709

## 2023-07-15 NOTE — Telephone Encounter (Signed)
 Copied from CRM [id number]. Topic: Clinical - Red Word Triage >> Jul 15, 2023 10:48 AM Colleen Owen wrote: Red Word that prompted transfer to Nurse Triage: severe diarrhea/poss dehydration/too weak to drive Had c-diff a couple of yrs ago, and fell like the same    Chief Complaint: Diarrhea Symptoms: Diarrhea, weakness Frequency: Ongoing since Saturday Pertinent Negatives: Patient denies nausea, vomiting Disposition: [x] ED /[] Urgent Care (no appt availability in office) / [] Appointment(In office/virtual)/ []  [facility]/ [] Home Care/ [] Refused Recommended Disposition /[] [facility]/ []  Follow-up with PCP Additional Notes: Patient stated that she has been having diarrhea since Saturday and she is also having abdominal pain. Her stools are completely watery. On average, she is having 10-15 episodes a day. Patient also reported that she is feeling weak. She saw her PCP on Monday and they said that her test results would return in a week. Patient stated she can't wait that long. She is inquiring if she goes to ED, will her test results come back quickly. Patient suspects that she has C-diff. Informed patient that I do not have information regarding how quickly test results would return. Advised patient that ED is recommended to treat possible dehydration. Patient verbalized understanding and stated that her son will take her there. Instructed patient to follow up with her PCP at [facility] after her ED visit.   Reason for Disposition  [1] Drinking very little AND [2] dehydration suspected (e.g., no urine > 12 hours, very dry mouth, very lightheaded)    Dehydration suspected due to patient feeling weak  Answer Assessment - Initial Assessment Questions 1. DIARRHEA SEVERITY: "How bad is the diarrhea?" "How many more stools have you had in the past 24 hours than normal?"    - NO DIARRHEA (SCALE 0)   - MILD (SCALE 1-3): Few loose or mushy BMs; increase of 1-3 stools over normal daily  number of stools; mild increase in ostomy output.   -  MODERATE (SCALE 4-7): Increase of 4-6 stools daily over normal; moderate increase in ostomy output.   -  SEVERE (SCALE 8-10; OR "WORST POSSIBLE"): Increase of 7 or more stools daily over normal; moderate increase in ostomy output; incontinence.     10-15 a day, has had 4 episodes so far today  2. ONSET: "When did the diarrhea begin?"      Saturday  3. BM CONSISTENCY: "How loose or watery is the diarrhea?"      Watery   4. VOMITING: "Are you also vomiting?" If Yes, ask: "How many times in the past 24 hours?"      No  5. ABDOMEN PAIN: "Are you having any abdomen pain?" If Yes, ask: "What does it feel like?" (e.g., crampy, dull, intermittent, constant)      Yes  6. ABDOMEN PAIN SEVERITY: If present, ask: "How bad is the pain?"  (e.g., Scale 1-10; mild, moderate, or severe)   - MILD (1-3): doesn't interfere with normal activities, abdomen soft and not tender to touch    - MODERATE (4-7): interferes with normal activities or awakens from sleep, abdomen tender to touch    - SEVERE (8-10): excruciating pain, doubled over, unable to do any normal activities   5/10 currently, and it is a 10/10 when palpated/pressed      7. HYDRATION: "Any signs of dehydration?" (e.g., dry mouth [not just dry lips], too weak to stand, dizziness, new weight loss) "When did you last urinate?"     Weakness, does not feel comfortable to drive  8. ANTIBIOTIC USE: "Are you taking antibiotics now or have you taken antibiotics in the past 2 months?"       No  Protocols used: Diarrhea-A-AH

## 2023-07-15 NOTE — ED Notes (Addendum)
 Pt un hooked, from monitor to go to the bathroom.

## 2023-07-15 NOTE — ED Notes (Signed)
 Patient refused her CT scan. Dr. Deretha Emory aware and in to speak with patient.

## 2023-07-15 NOTE — ED Triage Notes (Signed)
 Pt via pov from home with diarrhea since Saturday. Pt has hx of c-dif. PCP tested her for c-dif and norovirus but no results yet. She called PCP office and was told it could take a week for results and to come to ED if she was still having symptoms. Pt alertl & oriented, nad noted.

## 2023-07-15 NOTE — Discharge Instructions (Signed)
 Follow-up with your stool panel and your C. difficile results with your primary care doctor.  Certainly would recommend that if the abdominal pain persist or vomiting develops that you need a CT scan of the abdomen and pelvis.  Understand that you did not want to do that today.  Return for any new or worse symptoms.

## 2023-07-16 LAB — GASTROINTESTINAL PANEL BY PCR, STOOL (REPLACES STOOL CULTURE)

## 2023-07-16 LAB — C DIFFICILE QUICK SCREEN W PCR REFLEX
C Diff antigen: NEGATIVE
C Diff interpretation: NOT DETECTED
C Diff toxin: NEGATIVE

## 2023-09-23 ENCOUNTER — Encounter: Payer: Self-pay | Admitting: Student in an Organized Health Care Education/Training Program

## 2023-09-23 ENCOUNTER — Ambulatory Visit: Admitting: Student in an Organized Health Care Education/Training Program

## 2023-09-23 ENCOUNTER — Ambulatory Visit (INDEPENDENT_AMBULATORY_CARE_PROVIDER_SITE_OTHER): Admitting: Student in an Organized Health Care Education/Training Program

## 2023-09-23 VITALS — BP 150/93 | HR 92 | Wt 120.0 lb

## 2023-09-23 DIAGNOSIS — H6123 Impacted cerumen, bilateral: Secondary | ICD-10-CM | POA: Diagnosis not present

## 2023-09-23 DIAGNOSIS — L409 Psoriasis, unspecified: Secondary | ICD-10-CM

## 2023-09-23 DIAGNOSIS — F411 Generalized anxiety disorder: Secondary | ICD-10-CM | POA: Diagnosis not present

## 2023-09-23 DIAGNOSIS — E038 Other specified hypothyroidism: Secondary | ICD-10-CM

## 2023-09-23 DIAGNOSIS — F17200 Nicotine dependence, unspecified, uncomplicated: Secondary | ICD-10-CM | POA: Diagnosis not present

## 2023-09-23 DIAGNOSIS — H6120 Impacted cerumen, unspecified ear: Secondary | ICD-10-CM | POA: Insufficient documentation

## 2023-09-23 NOTE — Assessment & Plan Note (Signed)
 Chronic and stable.  Doing well on Synthroid  112 mcg daily.  No symptoms hypo or hyperthyroidism.

## 2023-09-23 NOTE — Assessment & Plan Note (Signed)
 Chronic and stable.  Well treated for the last 20 years with a combination of amitriptyline  and alprazolam .  She has had no side effects to this medication regimen.  She reports good benefit.  Very functional person.  Lots of life stressors.  She wants to continue with this regimen.  Seems to be working well for her right now with low risk.  She just recently had a refill of these medicines, I recommended she follow-up with me in 4 weeks for refill and further management.  She is afraid of trying an SSRI.

## 2023-09-23 NOTE — Assessment & Plan Note (Signed)
 Chronic and stable.  Mostly affects her hands and feet.  Uses a combination of triamcinolone and another natural topical product.  She is happy with the control right now.

## 2023-09-23 NOTE — Assessment & Plan Note (Signed)
 Chronic and stable.  Many decades of smoking history.  Has tried to quit 5 times, not successful.  Precontemplative right now about cessation.  Complicated by emphysema.

## 2023-09-23 NOTE — Assessment & Plan Note (Signed)
 Procedure Note: Manual Removal of Impacted Cerumen Using a Curette   Indication:  Cerumen impaction causing symptoms (e.g., hearing loss, pain, tinnitus) or preventing assessment of the ear canal and tympanic membrane.  Procedure:  Explained the procedure to the patient and informed consent was obtained  Review patient history for contraindications (e.g., nonintact tympanic membrane, history of ear surgery, anatomical abnormalities).  After a position of the patient's head upright, I visualized the ear canal and cerumen using an otoscope.  I gently inserted the curette into the ear canal avoiding contact with the canal walls, and carefully scooped the cerumen, removing it in small pieces.  I reassessed the ear canal and tympanic membrane, there was no residual cerumen nor signs of trauma.  Follow-Up:   I instructed the patient to report any persistent symptoms such as pain, discharge, or hearing loss.  Schedule a follow-up appointment if necessary.

## 2023-09-23 NOTE — Progress Notes (Signed)
 New Patient Office Visit  Subjective    Patient ID: Colleen Owen, female    DOB: 1956-02-19  Age: 68 y.o. MRN: [id number]  CC:   Chief Complaint  Patient presents with   Ear Fullness    Patient states right ear has been plugged for 1 week.     HPI  Colleen Owen presents to establish care  68 year old person living with chronic anxiety and tobacco use disorder here for management of bilateral hearing loss due to impacted cerumen.  She reports long history of having issues with earwax.  She does home irrigation usually about once per week.  Has been less effective the last few weeks.  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  No pain in the ears.  No use of Q-tips.  She was being well cared for at the [facility] clinic across the street, she called them to have a ear irrigations but was told that it would be at least a week before they could get her in.  So she decided to come here.  She has had generalized anxiety disorder for many years.  Has been on a historic regimen of amitriptyline  and alprazolam  for about 20 years and reports good benefit.  She uses a half a tablet of Xanax  in the morning and 1 tablet of Xanax  in the evening.  She reports good benefit with her sleep with this medication regimen.  Has had no falls.  No other complications.  No high risk features to her anxiety.  No history of bipolar disease.  She smokes cigarettes regularly, this has been for many decades, she struggles to quit smoking.  She has been told that she has COPD and emphysema but is on no inhalers.  Says she does not ever get sick, no history of hospitalizations or pneumonia.  She reports drinking a couple glasses of wine a day, says that helps with her anxiety.  Also smokes marijuana on a regular basis, also because it helps with her anxiety.  Reports having a lot of stressors at home between family and friends.    Outpatient  Encounter Medications as of 09/23/2023  Medication Sig   ALPRAZolam  (XANAX ) 0.5 MG tablet Take 1 tablet by mouth twice daily as needed for anxiety (Patient taking differently: Take 0.25 mg by mouth 2 (two) times daily as needed for anxiety.)   amitriptyline  (ELAVIL ) 25 MG tablet Take 1 tablet (25 mg total) by mouth at bedtime. NEEDS ApPT FOR FURTHER REFILLS (Patient taking differently: Take 25 mg by mouth at bedtime.)   levothyroxine  (SYNTHROID ) 112 MCG tablet Take 1 tablet (112 mcg total) by mouth daily.   Multiple Vitamin (MULTIVITAMIN) tablet Take 1 tablet by mouth daily.   No facility-administered encounter medications on file as of 09/23/2023.    Past Medical History:  Diagnosis Date   Acute bronchitis 02/20/2009   ANXIETY 09/08/2008   DEPRESSION 09/08/2008   ELEVATED BLOOD PRESSURE 07/16/2009   HYPERTENSION 09/08/2008   HYPOTHYROIDISM 09/08/2008   Psoriasis and similar disorders     Past Surgical History:  Procedure Laterality Date   URETHRA SURGERY  2008    Family History  Problem Relation Age of Onset   Cancer Mother        cancer   Hypertension Mother    Diabetes Father    Stroke Father    Alcohol abuse Maternal Grandfather     Social History  Socioeconomic History   Marital status: Legally Separated    Spouse name: Not on file   Number of children: Not on file   Years of education: Not on file   Highest education level: Not on file  Occupational History   Not on file  Tobacco Use   Smoking status: Every Day    Current packs/day: 1.50    Average packs/day: 1.5 packs/day for 45.0 years (67.5 ttl pk-yrs)    Types: Cigarettes   Smokeless tobacco: Never  Vaping Use   Vaping status: Never Used  Substance and Sexual Activity   Alcohol use: Yes    Alcohol/week: 10.0 standard drinks of alcohol    Types: 10 Standard drinks or equivalent per week   Drug use: Not Currently   Sexual activity: Not on file  Other Topics Concern   Not on file  Social History  Narrative   Not on file   Social Drivers of Health   Financial Resource Strain: Not on file  Food Insecurity: Low Risk  (03/11/2023)   Received from [facility]   Hunger Vital Sign    Worried About Running Out of Food in the Last Year: Never true    Ran Out of Food in the Last Year: Never true  Transportation Needs: No Transportation Needs (03/11/2023)   Received from [facility]    In the past 12 months, has lack of reliable transportation kept you from medical appointments, meetings, work or from getting things needed for daily living? : No  Physical Activity: Not on file  Stress: Not on file  Social Connections: Not on file  Intimate Partner Violence: Not on file        Objective    BP (!) 150/93   Pulse 92   Wt 120 lb (54.4 kg)   SpO2 98%   BMI 19.97 kg/m   Physical Exam  Gen: Well-appearing Ears: Bilateral ears are impacted with cerumen, this was removed with irrigation and curettes, right ear canal very inflamed, left ear canal mildly inflamed, both eardrums are intact with mild swelling Neck: No thyromegaly, no nodules or adenopathy Heart: Regular, no murmur Lungs: Decreased air movement, no crackles or wheezing Ext: Warm, no edema, erythema on both palms and feet with a fine silver scale Neuro: Alert, conversational, full strength upper and lower extremities Psych: Appropriate mood and affect, not anxious or depressed appearing      Assessment & Plan:   Problem List Items Addressed This Visit       High   GAD (generalized anxiety disorder)   Chronic and stable.  Well treated for the last 20 years with a combination of amitriptyline  and alprazolam .  She has had no side effects to this medication regimen.  She reports good benefit.  Very functional person.  Lots of life stressors.  She wants to continue with this regimen.  Seems to be working well for her right now with low risk.  She just recently had a refill of these medicines, I  recommended she follow-up with me in 4 weeks for refill and further management.  She is afraid of trying an SSRI.        Medium    Hypothyroidism   Chronic and stable.  Doing well on Synthroid  112 mcg daily.  No symptoms hypo or hyperthyroidism.        Low   Psoriasis   Chronic and stable.  Mostly affects her hands and feet.  Uses a combination of triamcinolone  and another natural topical product.  She is happy with the control right now.      Cerumen impaction - Primary   Procedure Note: Manual Removal of Impacted Cerumen Using a Curette   Indication:  Cerumen impaction causing symptoms (e.g., hearing loss, pain, tinnitus) or preventing assessment of the ear canal and tympanic membrane.  Procedure:  Explained the procedure to the patient and informed consent was obtained  Review patient history for contraindications (e.g., nonintact tympanic membrane, history of ear surgery, anatomical abnormalities).  After a position of the patient's head upright, I visualized the ear canal and cerumen using an otoscope.  I gently inserted the curette into the ear canal avoiding contact with the canal walls, and carefully scooped the cerumen, removing it in small pieces.  I reassessed the ear canal and tympanic membrane, there was no residual cerumen nor signs of trauma.  Follow-Up:  I instructed the patient to report any persistent symptoms such as pain, discharge, or hearing loss.  Schedule a follow-up appointment if necessary.       Tobacco use disorder   Chronic and stable.  Many decades of smoking history.  Has tried to quit 5 times, not successful.  Precontemplative right now about cessation.  Complicated by emphysema.       Return in about 4 weeks (around 10/21/2023).   Ether Hercules, MD

## 2023-10-26 ENCOUNTER — Ambulatory Visit: Admitting: Student in an Organized Health Care Education/Training Program
